# Patient Record
Sex: Male | Born: 1970
Health system: Southern US, Community
[De-identification: ages and names within clinical notes are randomized; demographics above are authoritative.]

## PROBLEM LIST (undated history)

## (undated) HISTORY — PX: KNEE SURGERY: SHX244

## (undated) HISTORY — PX: OTHER SURGICAL HISTORY: SHX169

---
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
## 1998-08-15 ENCOUNTER — Emergency Department (HOSPITAL_COMMUNITY): Admission: EM | Admit: 1998-08-15 | Discharge: 1998-08-15 | Payer: Self-pay | Admitting: Emergency Medicine

## 2003-01-23 ENCOUNTER — Emergency Department (HOSPITAL_COMMUNITY): Admission: AD | Admit: 2003-01-23 | Discharge: 2003-01-24 | Payer: Self-pay | Admitting: Emergency Medicine

## 2003-07-18 ENCOUNTER — Emergency Department (HOSPITAL_COMMUNITY): Admission: EM | Admit: 2003-07-18 | Discharge: 2003-07-18 | Payer: Self-pay | Admitting: Emergency Medicine

## 2004-05-21 ENCOUNTER — Emergency Department (HOSPITAL_COMMUNITY): Admission: EM | Admit: 2004-05-21 | Discharge: 2004-05-21 | Payer: Self-pay | Admitting: Family Medicine

## 2006-05-13 ENCOUNTER — Emergency Department (HOSPITAL_COMMUNITY): Admission: EM | Admit: 2006-05-13 | Discharge: 2006-05-13 | Payer: Self-pay | Admitting: Emergency Medicine

## 2008-11-06 ENCOUNTER — Emergency Department (HOSPITAL_COMMUNITY): Admission: EM | Admit: 2008-11-06 | Discharge: 2008-11-06 | Payer: Self-pay | Admitting: Family Medicine

## 2008-11-29 ENCOUNTER — Emergency Department (HOSPITAL_COMMUNITY): Admission: EM | Admit: 2008-11-29 | Discharge: 2008-11-30 | Payer: Self-pay | Admitting: Emergency Medicine

## 2010-08-08 ENCOUNTER — Ambulatory Visit: Admit: 2010-08-08 | Payer: Self-pay | Admitting: Internal Medicine

## 2010-08-08 ENCOUNTER — Ambulatory Visit (HOSPITAL_COMMUNITY)
Admission: RE | Admit: 2010-08-08 | Discharge: 2010-08-08 | Payer: Self-pay | Source: Home / Self Care | Attending: Internal Medicine | Admitting: Internal Medicine

## 2010-08-23 NOTE — Op Note (Signed)
  NAME:  William Haley, William Haley             ACCOUNT NO.:  [id number]  MEDICAL RECORD NO.:  [id number]          PATIENT TYPE:  AMB  LOCATION:  DAY                           FACILITY:  [facility]  PHYSICIAN:  Lionel December, M.D.    DATE OF BIRTH:  1970-11-24  DATE OF PROCEDURE:  08/08/2010 DATE OF DISCHARGE:                              OPERATIVE REPORT   INDICATION:  Favor is a 40 year old African American male who is undergoing high-risk screening colonoscopy.  His father died from metastatic colon carcinoma at age 50 shortly after diagnosis. Therefore, it is presumed that his colon cancer started younger than 28.  Procedure risks were reviewed with the patient.  Informed consent was obtained.  MEDICATIONS FOR CONSCIOUS SEDATION:  Demerol 50 mg IV, Versed 6 mg IV.  FINDINGS:  Procedure performed in endoscopy suite.  The patient's vital signs and O2 sat were monitored during the procedure and remained stable.  The patient was placed in left lateral recumbent position and rectal examination was performed.  No abnormality noted on external or digital exam.  Pentax videoscope was placed in the rectum and advanced under vision into sigmoid colon and beyond.  Preparation was excellent.  Scope was passed into the cecum which was identified by appendiceal orifice and ileocecal valve.  The right-to-left of appendiceal orifice revealed expanded mucosa with abnormal section.  It was therefore removed via biopsy to make sure this is not a small serrated adenoma.  As the scope was withdrawn, rest of the colonic mucosa was carefully examined.  There were 2 tiny polyps at transverse colon which were ablated via cold biopsy and submitted in 1 container.  There was another 2-3-mm polyp at rectum with clear preformed surface.  This was easily ablated via cold biopsy.  Scope was retroflexed to examine anorectal junction.  He has some focal erythema, felt to be secondary to prep.  Endoscope was then withdrawn.   The patient tolerated the procedure well.  Withdrawal time was 20 minutes.  FINAL DIAGNOSES: 1. Focal area of mucosal thickening at the right lip of appendiceal     orifice.  It was biopsied to make sure it is not a small adenoma. 2. Two small polyps ablated via cold biopsy from transverse colon and     submitted in 1 container. 3. Small polyp ablated via cold biopsy from the rectum.  RECOMMENDATIONS: 1. Standard instructions given. 2. I will be contacting [facility] with biopsy results and further     recommendations.  His next exam possibly would be in 5 years.     Lionel December, M.D.     NR/MEDQ  D:  08/08/2010  T:  08/08/2010  Job:  [id number]  cc:   Thora Lance, M.D. Fax: [phone]  Electronically Signed by Lionel December M.D. on 08/23/2010 01:04:47 PM

## 2010-10-29 LAB — POCT I-STAT, CHEM 8
BUN: 11 mg/dL (ref 6–23)
Chloride: 105 mEq/L (ref 96–112)
Creatinine, Ser: 1.4 mg/dL (ref 0.4–1.5)
Hemoglobin: 17.7 g/dL — ABNORMAL HIGH (ref 13.0–17.0)
Potassium: 3.3 mEq/L — ABNORMAL LOW (ref 3.5–5.1)
Sodium: 139 mEq/L (ref 135–145)

## 2010-10-29 LAB — DIFFERENTIAL
Eosinophils Absolute: 0 10*3/uL (ref 0.0–0.7)
Lymphocytes Relative: 52 % — ABNORMAL HIGH (ref 12–46)
Lymphs Abs: 2.8 10*3/uL (ref 0.7–4.0)
Neutro Abs: 2 10*3/uL (ref 1.7–7.7)
Neutrophils Relative %: 37 % — ABNORMAL LOW (ref 43–77)

## 2010-10-29 LAB — CBC
Platelets: 215 10*3/uL (ref 150–400)
WBC: 5.3 10*3/uL (ref 4.0–10.5)

## 2010-10-29 LAB — URINALYSIS, ROUTINE W REFLEX MICROSCOPIC
Hgb urine dipstick: NEGATIVE
Specific Gravity, Urine: 1.016 (ref 1.005–1.030)
Urobilinogen, UA: 0.2 mg/dL (ref 0.0–1.0)

## 2011-03-19 ENCOUNTER — Encounter: Payer: Self-pay | Admitting: Orthopedic Surgery

## 2011-03-19 ENCOUNTER — Ambulatory Visit (INDEPENDENT_AMBULATORY_CARE_PROVIDER_SITE_OTHER): Payer: 59 | Admitting: Orthopedic Surgery

## 2011-03-19 VITALS — Resp 18 | Ht 70.0 in | Wt 207.0 lb

## 2011-03-19 DIAGNOSIS — M2350 Chronic instability of knee, unspecified knee: Secondary | ICD-10-CM

## 2011-03-19 DIAGNOSIS — M238X9 Other internal derangements of unspecified knee: Secondary | ICD-10-CM

## 2011-03-19 DIAGNOSIS — Z9889 Other specified postprocedural states: Secondary | ICD-10-CM

## 2011-03-19 DIAGNOSIS — M23329 Other meniscus derangements, posterior horn of medial meniscus, unspecified knee: Secondary | ICD-10-CM

## 2011-03-19 NOTE — Patient Instructions (Signed)
Call us to schedule surgery

## 2011-03-19 NOTE — Progress Notes (Signed)
Chief complaint: Discomfort LEFT knee HPI:(4) This is a 40 year old male who had an ACL reconstruction of the LEFT knee with partial medial meniscectomy in 1989 with an iliotibial band graft presents complaining of swelling sharp pain which is intermittent associated with locking primarily after exercise such as golf.  He complains of pain walking up steps.  The pain is diffuse and not localized to a specific area.  ROS:(2) He denies any problems and her review of systems  PFSH: (1) History reviewed. No pertinent past medical history. Past Surgical History  Procedure Date  . Left knee 1989   . Knee surgery left knee 1989   History   Social History  . Marital Status: Married    Spouse Name: N/A    Number of Children: N/A  . Years of Education: post grad.   Occupational History  . healthcare    Social History Main Topics  . Smoking status: Never Smoker   . Smokeless tobacco: Not on file  . Alcohol Use: No  . Drug Use: Not on file  . Sexually Active: Not on file   Other Topics Concern  . Not on file   Social History Narrative  . No narrative on file     Physical Exam(12) GENERAL: normal development   CDV: pulses are normal   Skin: normal  Lymph: nodes were not palpable/normal  Psychiatric: awake, alert and oriented  Neuro: normal sensation  MSK Ambulation is normal  Upper extremity exam  Inspection and palpation revealed no abnormalities in the upper extremities.  Range of motion is full without contracture.  Motor exam is normal with grade 5 strength.  The joints are fully reduced without subluxation.  There is no atrophy or tremor and muscle tone is normal.  All joints are stable.     RIGHT knee inspection no tenderness or swelling, range of motion was normal.  Strength and stability assessment was normal as well. 1 LEFT knee evaluation reveals no tenderness along the joint lines no swelling.  There is crepitance in the patellofemoral joint 2 Range of  motion is 112 3 Strength is normal 4 The ACL feels stable, the PCL feels stable collateral ligaments are stable 5 The medial compartment is mildly tender McMurray sign is equivocal   Imaging: Summary of x-rays there is medial compartment narrowing with spurring at the margins of the femur.  There is a staple in the tibia from previous ACL reconstruction.  In the tunnel sites are more anterior than ideal.  Assessment: Osteoarthritis and possible medial meniscal tear LEFT knee  As discussed with Mr. Silversmith I think he is having arthritis from his previous knee injuries which is starting to get worse he may have a meniscal tear recurrent his ACL graft seems to have stabilized his knee.  He is not having instability.  I recommended a arthroscopic debridement and partial meniscectomy if needed LEFT knee  He will call us when he is ready to schedule surgery.  X-ray report 3 views LEFT knee  Reason for x-ray LEFT knee pain  The overall line of the knee is slight varus.  There is joint space narrowing medially with marginal spurs on the femoral and tibial margins.  There is evidence of previous ACL reconstructive surgery with anterior position of the femoral and tibial tunnel and a staple in the tibial proximal shaft.  Impression osteoarthritis with slight varus and evidence of previous ACL reconstructive surgery.    Plan: As stated above

## 2011-06-17 ENCOUNTER — Ambulatory Visit (INDEPENDENT_AMBULATORY_CARE_PROVIDER_SITE_OTHER): Payer: 59 | Admitting: Orthopedic Surgery

## 2011-06-17 ENCOUNTER — Encounter: Payer: Self-pay | Admitting: Orthopedic Surgery

## 2011-06-17 DIAGNOSIS — Z9889 Other specified postprocedural states: Secondary | ICD-10-CM

## 2011-06-17 DIAGNOSIS — M23329 Other meniscus derangements, posterior horn of medial meniscus, unspecified knee: Secondary | ICD-10-CM

## 2011-06-17 NOTE — Patient Instructions (Signed)
You have been scheduled for surgery.  All surgeries carry some risk.  Remember you always have the option of continued nonsurgical treatment. However in this situation the risks vs. the benefits favor surgery as the best treatment option. The risks of the surgery includes the following but is not limited to bleeding, infection, pulmonary embolus, death from anesthesia, nerve injury vascular injury or need for further surgery, continued pain.  Specific to this procedure the following risks and complications are rare but possible Stiffness, pain, swelling

## 2011-06-17 NOTE — Progress Notes (Signed)
Mr. Mansouri comes in today for preoperative evaluation for arthroscopy of his LEFT knee as previously discussed.  Preop visit to be completed at a later date with a history and physical.

## 2011-06-27 ENCOUNTER — Other Ambulatory Visit: Payer: Self-pay | Admitting: *Deleted

## 2011-06-27 ENCOUNTER — Encounter: Payer: Self-pay | Admitting: Orthopedic Surgery

## 2011-07-11 ENCOUNTER — Encounter (HOSPITAL_COMMUNITY): Payer: Self-pay

## 2011-07-18 ENCOUNTER — Encounter (HOSPITAL_COMMUNITY): Payer: Self-pay

## 2011-07-18 ENCOUNTER — Telehealth: Payer: Self-pay | Admitting: Orthopedic Surgery

## 2011-07-18 ENCOUNTER — Encounter (HOSPITAL_COMMUNITY)
Admission: RE | Admit: 2011-07-18 | Discharge: 2011-07-18 | Disposition: A | Discharge status: Home / Self Care | Payer: 59 | Source: Ambulatory Visit | Attending: Orthopedic Surgery | Admitting: Orthopedic Surgery

## 2011-07-18 LAB — SURGICAL PCR SCREEN
MRSA, PCR: NEGATIVE
Staphylococcus aureus: NEGATIVE

## 2011-07-18 NOTE — Telephone Encounter (Signed)
Contacted insurer, [place], [place] # [phone] re: out-patient surgery scheduled 07/28/11 at [facility], left knee, CPT codes 09811, [phone].  Per Dawn, no pre-authorization required for out-patient procedures.

## 2011-07-18 NOTE — Patient Instructions (Addendum)
A20 JACOB CICERO  07/18/2011   Your procedure is scheduled on:  07/25/3011  Report to [facility] at  615  AM.  Call this number if you have problems the morning of surgery: [phone]   Remember:   Do not eat food:After Midnight.  May have clear liquids:until Midnight .  Clear liquids include soda, tea, black coffee, apple or grape juice, broth.  Take these medicines the morning of surgery with A SIP OF WATER: none   Do not wear jewelry, make-up or nail polish.  Do not wear lotions, powders, or perfumes. You may wear deodorant.  Do not shave 48 hours prior to surgery.  Do not bring valuables to the hospital.  Contacts, dentures or bridgework may not be worn into surgery.  Leave suitcase in the car. After surgery it may be brought to your room.  For patients admitted to the hospital, checkout time is 11:00 AM the day of discharge.   Patients discharged the day of surgery will not be allowed to drive home.  Name and phone number of your driver: family  Special Instructions: CHG Shower Use Special Wash: 1/2 bottle night before surgery and 1/2 bottle morning of surgery.   Please read over the following fact sheets that you were given: Pain Booklet, MRSA Information, Surgical Site Infection Prevention, Anesthesia Post-op Instructions and Care and Recovery After Surgery rthroscopic Procedure, Knee An arthroscopic procedure can find what is wrong with your knee. PROCEDURE Arthroscopy is a surgical technique that allows your orthopedic surgeon to diagnose and treat your knee injury with accuracy. They will look into your knee through a small instrument. This is almost like a small (pencil sized) telescope. Because arthroscopy affects your knee less than open knee surgery, you can anticipate a more rapid recovery. Taking an active role by following your caregiver's instructions will help with rapid and complete recovery. Use crutches, rest, elevation, ice, and knee exercises as instructed. The  length of recovery depends on various factors including type of injury, age, physical condition, medical conditions, and your rehabilitation. Your knee is the joint between the large bones (femur and tibia) in your leg. Cartilage covers these bone ends which are smooth and slippery and allow your knee to bend and move smoothly. Two menisci, thick, semi-lunar shaped pads of cartilage which form a rim inside the joint, help absorb shock and stabilize your knee. Ligaments bind the bones together and support your knee joint. Muscles move the joint, help support your knee, and take stress off the joint itself. Because of this all programs and physical therapy to rehabilitate an injured or repaired knee require rebuilding and strengthening your muscles. AFTER THE PROCEDURE  After the procedure, you will be moved to a recovery area until most of the effects of the medication have worn off. Your caregiver will discuss the test results with you.   Only take over-the-counter or prescription medicines for pain, discomfort, or fever as directed by your caregiver.  SEEK MEDICAL CARE IF:   You have increased bleeding from your wounds.   You see redness, swelling, or have increasing pain in your wounds.   You have pus coming from your wound.   You have an oral temperature above 102 F (38.9 C).   You notice a bad smell coming from the wound or dressing.   You have severe pain with any motion of your knee.  SEEK IMMEDIATE MEDICAL CARE IF:   You develop a rash.   You have difficulty breathing.  You have any allergic problems.  Document Released: 07/04/2000 Document Revised: 03/19/2011 Document Reviewed: 01/26/2008 [facility] Patient Information 2012 [place].PATIENT INSTRUCTIONS POST-ANESTHESIA  IMMEDIATELY FOLLOWING SURGERY:  Do not drive or operate machinery for the first twenty four hours after surgery.  Do not make any important decisions for twenty four hours after surgery or while taking  narcotic pain medications or sedatives.  If you develop intractable nausea and vomiting or a severe headache please notify your doctor immediately.  FOLLOW-UP:  Please make an appointment with your surgeon as instructed. You do not need to follow up with anesthesia unless specifically instructed to do so.  WOUND CARE INSTRUCTIONS (if applicable):  Keep a dry clean dressing on the anesthesia/puncture wound site if there is drainage.  Once the wound has quit draining you may leave it open to air.  Generally you should leave the bandage intact for twenty four hours unless there is drainage.  If the epidural site drains for more than 36-48 hours please call the anesthesia department.  QUESTIONS?:  Please feel free to call your physician or the hospital operator if you have any questions, and they will be happy to assist you.     [place] [phone]

## 2011-07-24 NOTE — H&P (Signed)
Chief complaint: Discomfort LEFT knee  HPI:(4) This is a 41 year old male who had an ACL reconstruction of the LEFT knee with partial medial meniscectomy in 1989 with an iliotibial band graft presents complaining of swelling sharp pain which is intermittent associated with locking primarily after exercise such as golf. He complains of pain walking up steps. The pain is diffuse and not localized to a specific area.  ROS:(2) He denies any problems and her review of systems  PFSH: (1) History reviewed. No pertinent past medical history.  Past Surgical History   Procedure  Date   .  Left knee 1989    .  Knee surgery  left knee 1989    History    Social History   .  Marital Status:  Married     Spouse Name:  N/A     Number of Children:  N/A   .  Years of Education:  post grad.    Occupational History   .  healthcare     Social History Main Topics   .  Smoking status:  Never Smoker   .  Smokeless tobacco:  Not on file   .  Alcohol Use:  No   .  Drug Use:  Not on file   .  Sexually Active:  Not on file    Other Topics  Concern   .  Not on file    Social History Narrative   .  No narrative on file    Physical Exam(12)  GENERAL: normal development  CDV: pulses are normal  Skin: normal  Lymph: nodes were not palpable/normal  Psychiatric: awake, alert and oriented  Neuro: normal sensation  MSK Ambulation is normal  Upper extremity exam  Inspection and palpation revealed no abnormalities in the upper extremities. Range of motion is full without contracture.  Motor exam is normal with grade 5 strength.  The joints are fully reduced without subluxation.  There is no atrophy or tremor and muscle tone is normal. All joints are stable.  RIGHT knee inspection no tenderness or swelling, range of motion was normal. Strength and stability assessment was normal as well.  1 LEFT knee evaluation reveals no tenderness along the joint lines no swelling. There is crepitance in the  patellofemoral joint  2 Range of motion is 112  3 Strength is normal  4 The ACL feels stable, the PCL feels stable collateral ligaments are stable  5 The medial compartment is mildly tender McMurray sign is equivocal  Imaging: Summary of x-rays there is medial compartment narrowing with spurring at the margins of the femur. There is a staple in the tibia from previous ACL reconstruction. In the tunnel sites are more anterior than ideal.  Assessment: Osteoarthritis and possible medial meniscal tear LEFT knee  As discussed with Mr. Carithers I think he is having arthritis from his previous knee injuries which is starting to get worse he may have a meniscal tear recurrent his ACL graft seems to have stabilized his knee. He is not having instability. I recommended a arthroscopic debridement and partial meniscectomy if needed LEFT knee  He will call us when he is ready to schedule surgery.  X-ray report 3 views LEFT knee  Reason for x-ray LEFT knee pain  The overall line of the knee is slight varus. There is joint space narrowing medially with marginal spurs on the femoral and tibial margins. There is evidence of previous ACL reconstructive surgery with anterior position of the femoral and tibial tunnel and  a staple in the tibial proximal shaft.  Impression osteoarthritis with slight varus and evidence of previous ACL reconstructive surgery.  Plan:  I recommended a arthroscopic debridement and partial meniscectomy if needed LEFT knee

## 2011-07-25 ENCOUNTER — Ambulatory Visit (HOSPITAL_COMMUNITY)
Admission: RE | Admit: 2011-07-25 | Discharge: 2011-07-25 | Disposition: A | Discharge status: Home / Self Care | Payer: 59 | Source: Ambulatory Visit | Attending: Orthopedic Surgery | Admitting: Orthopedic Surgery

## 2011-07-25 ENCOUNTER — Encounter (HOSPITAL_COMMUNITY): Payer: Self-pay | Admitting: Anesthesiology

## 2011-07-25 ENCOUNTER — Encounter (HOSPITAL_COMMUNITY): Payer: Self-pay | Admitting: *Deleted

## 2011-07-25 ENCOUNTER — Encounter (HOSPITAL_COMMUNITY)
Admission: RE | Disposition: A | Discharge status: Home / Self Care | Payer: Self-pay | Source: Ambulatory Visit | Attending: Orthopedic Surgery

## 2011-07-25 DIAGNOSIS — IMO0002 Reserved for concepts with insufficient information to code with codable children: Secondary | ICD-10-CM | POA: Insufficient documentation

## 2011-07-25 DIAGNOSIS — Z01812 Encounter for preprocedural laboratory examination: Secondary | ICD-10-CM | POA: Insufficient documentation

## 2011-07-25 DIAGNOSIS — M23329 Other meniscus derangements, posterior horn of medial meniscus, unspecified knee: Secondary | ICD-10-CM

## 2011-07-25 DIAGNOSIS — M23305 Other meniscus derangements, unspecified medial meniscus, unspecified knee: Secondary | ICD-10-CM | POA: Insufficient documentation

## 2011-07-25 DIAGNOSIS — M1710 Unilateral primary osteoarthritis, unspecified knee: Secondary | ICD-10-CM | POA: Insufficient documentation

## 2011-07-25 SURGERY — ARTHROSCOPY, KNEE, WITH MEDIAL MENISCECTOMY
Anesthesia: General | Site: Knee | Laterality: Left | Wound class: Clean

## 2011-07-25 MED ORDER — ACETAMINOPHEN 325 MG PO TABS: 325.0000 mg | ORAL_TABLET | ORAL | Status: DC | PRN

## 2011-07-25 MED ORDER — PROPOFOL 10 MG/ML IV BOLUS
INTRAVENOUS | Status: DC | PRN
  Administered 2011-07-25: 170 mg via INTRAVENOUS

## 2011-07-25 MED ORDER — ACETAMINOPHEN 500 MG PO TABS
500.0000 mg | ORAL_TABLET | Freq: Once | ORAL | Status: AC
  Administered 2011-07-25: 500 mg via ORAL

## 2011-07-25 MED ORDER — ONDANSETRON HCL 4 MG/2ML IJ SOLN
4.0000 mg | Freq: Once | INTRAMUSCULAR | Status: AC
  Administered 2011-07-25: 4 mg via INTRAVENOUS

## 2011-07-25 MED ORDER — CELECOXIB 100 MG PO CAPS
400.0000 mg | ORAL_CAPSULE | Freq: Once | ORAL | Status: AC
  Administered 2011-07-25: 400 mg via ORAL

## 2011-07-25 MED ORDER — SODIUM CHLORIDE 0.9 % IR SOLN
Status: DC | PRN
  Administered 2011-07-25: 1000 mL

## 2011-07-25 MED ORDER — ONDANSETRON HCL 4 MG/2ML IJ SOLN: 4.0000 mg | Freq: Once | INTRAMUSCULAR | Status: DC

## 2011-07-25 MED ORDER — CEFAZOLIN SODIUM 1-5 GM-% IV SOLN
INTRAVENOUS | Status: AC
  Filled 2011-07-25: qty 50

## 2011-07-25 MED ORDER — EPHEDRINE SULFATE 50 MG/ML IJ SOLN
INTRAMUSCULAR | Status: AC
  Filled 2011-07-25: qty 1

## 2011-07-25 MED ORDER — ONDANSETRON HCL 4 MG/2ML IJ SOLN
INTRAMUSCULAR | Status: AC
  Administered 2011-07-25: 4 mg via INTRAVENOUS
  Filled 2011-07-25: qty 2

## 2011-07-25 MED ORDER — CEFAZOLIN SODIUM 1-5 GM-% IV SOLN
INTRAVENOUS | Status: DC | PRN
  Administered 2011-07-25: 1 g via INTRAVENOUS

## 2011-07-25 MED ORDER — ACETAMINOPHEN 500 MG PO TABS
ORAL_TABLET | ORAL | Status: AC
  Administered 2011-07-25: 500 mg via ORAL
  Filled 2011-07-25: qty 1

## 2011-07-25 MED ORDER — BUPIVACAINE-EPINEPHRINE PF 0.5-1:200000 % IJ SOLN
INTRAMUSCULAR | Status: AC
  Filled 2011-07-25: qty 20

## 2011-07-25 MED ORDER — MIDAZOLAM HCL 2 MG/2ML IJ SOLN
INTRAMUSCULAR | Status: AC
  Administered 2011-07-25: 2 mg via INTRAVENOUS
  Filled 2011-07-25: qty 2

## 2011-07-25 MED ORDER — ONDANSETRON HCL 4 MG/2ML IJ SOLN: 4.0000 mg | Freq: Once | INTRAMUSCULAR | Status: DC | PRN

## 2011-07-25 MED ORDER — ONDANSETRON HCL 4 MG/2ML IJ SOLN
INTRAMUSCULAR | Status: AC
  Filled 2011-07-25: qty 2

## 2011-07-25 MED ORDER — LIDOCAINE HCL (PF) 1 % IJ SOLN
INTRAMUSCULAR | Status: AC
  Filled 2011-07-25: qty 5

## 2011-07-25 MED ORDER — LIDOCAINE HCL 1 % IJ SOLN
INTRAMUSCULAR | Status: DC | PRN
  Administered 2011-07-25: 30 mg via INTRADERMAL

## 2011-07-25 MED ORDER — FENTANYL CITRATE 0.05 MG/ML IJ SOLN
INTRAMUSCULAR | Status: AC
  Filled 2011-07-25: qty 2

## 2011-07-25 MED ORDER — KETOROLAC TROMETHAMINE 30 MG/ML IJ SOLN
30.0000 mg | Freq: Once | INTRAMUSCULAR | Status: AC
  Administered 2011-07-25: 30 mg via INTRAVENOUS

## 2011-07-25 MED ORDER — LACTATED RINGERS IV SOLN
INTRAVENOUS | Status: DC
  Administered 2011-07-25: 08:00:00 via INTRAVENOUS

## 2011-07-25 MED ORDER — PROPOFOL 10 MG/ML IV EMUL
INTRAVENOUS | Status: AC
  Filled 2011-07-25: qty 20

## 2011-07-25 MED ORDER — EPINEPHRINE HCL 1 MG/ML IJ SOLN
INTRAMUSCULAR | Status: AC
  Filled 2011-07-25: qty 6

## 2011-07-25 MED ORDER — HYDROCODONE-ACETAMINOPHEN 5-325 MG PO TABS
1.0000 | ORAL_TABLET | Freq: Once | ORAL | Status: AC
  Administered 2011-07-25: 1 via ORAL

## 2011-07-25 MED ORDER — IBUPROFEN 800 MG PO TABS: 800.0000 mg | ORAL_TABLET | Freq: Three times a day (TID) | ORAL | Status: AC | PRN

## 2011-07-25 MED ORDER — HYDROCODONE-ACETAMINOPHEN 7.5-325 MG PO TABS: 1.0000 | ORAL_TABLET | ORAL | Status: AC | PRN

## 2011-07-25 MED ORDER — GLYCOPYRROLATE 0.2 MG/ML IJ SOLN
0.2000 mg | Freq: Once | INTRAMUSCULAR | Status: AC | PRN
  Administered 2011-07-25: 0.2 mg via INTRAVENOUS

## 2011-07-25 MED ORDER — BUPIVACAINE-EPINEPHRINE PF 0.5-1:200000 % IJ SOLN
INTRAMUSCULAR | Status: DC | PRN
  Administered 2011-07-25: 60 mL

## 2011-07-25 MED ORDER — HYDROCODONE-ACETAMINOPHEN 5-325 MG PO TABS
ORAL_TABLET | ORAL | Status: AC
  Administered 2011-07-25: 1 via ORAL
  Filled 2011-07-25: qty 1

## 2011-07-25 MED ORDER — CELECOXIB 100 MG PO CAPS
ORAL_CAPSULE | ORAL | Status: AC
  Administered 2011-07-25: 400 mg via ORAL
  Filled 2011-07-25: qty 4

## 2011-07-25 MED ORDER — MIDAZOLAM HCL 2 MG/2ML IJ SOLN
1.0000 mg | INTRAMUSCULAR | Status: DC | PRN
  Administered 2011-07-25: 2 mg via INTRAVENOUS

## 2011-07-25 MED ORDER — FENTANYL CITRATE 0.05 MG/ML IJ SOLN: 25.0000 ug | INTRAMUSCULAR | Status: DC | PRN

## 2011-07-25 MED ORDER — SODIUM CHLORIDE 0.9 % IR SOLN
Status: DC | PRN
  Administered 2011-07-25: 09:00:00

## 2011-07-25 MED ORDER — FENTANYL CITRATE 0.05 MG/ML IJ SOLN
INTRAMUSCULAR | Status: DC | PRN
  Administered 2011-07-25: 25 ug via INTRAVENOUS
  Administered 2011-07-25: 50 ug via INTRAVENOUS
  Administered 2011-07-25: 25 ug via INTRAVENOUS

## 2011-07-25 MED ORDER — GLYCOPYRROLATE 0.2 MG/ML IJ SOLN
INTRAMUSCULAR | Status: AC
  Administered 2011-07-25: 0.2 mg via INTRAVENOUS
  Filled 2011-07-25: qty 1

## 2011-07-25 MED ORDER — KETOROLAC TROMETHAMINE 30 MG/ML IJ SOLN
INTRAMUSCULAR | Status: AC
  Administered 2011-07-25: 30 mg via INTRAVENOUS
  Filled 2011-07-25: qty 1

## 2011-07-25 MED ORDER — CEFAZOLIN SODIUM 1-5 GM-% IV SOLN: 1.0000 g | INTRAVENOUS | Status: DC

## 2011-07-25 SURGICAL SUPPLY — 56 items
ARTHROWAND PARAGON T2 (SURGICAL WAND)
BAG HAMPER (MISCELLANEOUS) ×2 IMPLANT
BANDAGE ELASTIC 6 VELCRO NS (GAUZE/BANDAGES/DRESSINGS) ×2 IMPLANT
BLADE AGGRESSIVE PLUS 4.0 (BLADE) ×2 IMPLANT
BLADE SURG SZ11 CARB STEEL (BLADE) ×2 IMPLANT
CHLORAPREP W/TINT 26ML (MISCELLANEOUS) ×2 IMPLANT
CLOTH BEACON ORANGE TIMEOUT ST (SAFETY) ×2 IMPLANT
COOLER CRYO IC GRAV AND TUBE (ORTHOPEDIC SUPPLIES) ×2 IMPLANT
CUFF CRYO KNEE LG 20X31 COOLER (ORTHOPEDIC SUPPLIES) ×1 IMPLANT
CUFF CRYO KNEE18X23 MED (MISCELLANEOUS) IMPLANT
CUFF TOURNIQUET SINGLE 34IN LL (TOURNIQUET CUFF) ×1 IMPLANT
CUFF TOURNIQUET SINGLE 44IN (TOURNIQUET CUFF) IMPLANT
CUTTER ANGLED DBL BITE 4.5 (BURR) IMPLANT
DECANTER SPIKE VIAL GLASS SM (MISCELLANEOUS) ×4 IMPLANT
FLOOR PAD 36X40 (MISCELLANEOUS) ×2
GAUZE SPONGE 4X4 16PLY XRAY LF (GAUZE/BANDAGES/DRESSINGS) ×2 IMPLANT
GAUZE XEROFORM 5X9 LF (GAUZE/BANDAGES/DRESSINGS) ×2 IMPLANT
GLOVE BIOGEL PI IND STRL 7.0 (GLOVE) IMPLANT
GLOVE BIOGEL PI INDICATOR 7.0 (GLOVE) ×1
GLOVE SKINSENSE NS SZ8.0 LF (GLOVE) ×1
GLOVE SKINSENSE STRL SZ8.0 LF (GLOVE) ×1 IMPLANT
GLOVE SS BIOGEL STRL SZ 6.5 (GLOVE) IMPLANT
GLOVE SS N UNI LF 8.5 STRL (GLOVE) ×2 IMPLANT
GLOVE SUPERSENSE BIOGEL SZ 6.5 (GLOVE) ×1
GOWN STRL REIN XL XLG (GOWN DISPOSABLE) ×6 IMPLANT
HLDR LEG FOAM (MISCELLANEOUS) ×1 IMPLANT
IV NS IRRIG 3000ML ARTHROMATIC (IV SOLUTION) ×4 IMPLANT
KIT BLADEGUARD II DBL (SET/KITS/TRAYS/PACK) ×2 IMPLANT
KIT ROOM TURNOVER AP CYSTO (KITS) ×2 IMPLANT
LEG HOLDER FOAM (MISCELLANEOUS) ×1
MANIFOLD NEPTUNE II (INSTRUMENTS) ×2 IMPLANT
MARKER SKIN DUAL TIP RULER LAB (MISCELLANEOUS) ×2 IMPLANT
NDL HYPO 18GX1.5 BLUNT FILL (NEEDLE) ×1 IMPLANT
NDL HYPO 21X1.5 SAFETY (NEEDLE) ×1 IMPLANT
NDL SPNL 18GX3.5 QUINCKE PK (NEEDLE) ×1 IMPLANT
NEEDLE HYPO 18GX1.5 BLUNT FILL (NEEDLE) ×2 IMPLANT
NEEDLE HYPO 21X1.5 SAFETY (NEEDLE) ×2 IMPLANT
NEEDLE SPNL 18GX3.5 QUINCKE PK (NEEDLE) ×2 IMPLANT
NS IRRIG 1000ML POUR BTL (IV SOLUTION) ×2 IMPLANT
PACK ARTHRO LIMB DRAPE STRL (MISCELLANEOUS) ×2 IMPLANT
PAD ABD 5X9 TENDERSORB (GAUZE/BANDAGES/DRESSINGS) ×2 IMPLANT
PAD ARMBOARD 7.5X6 YLW CONV (MISCELLANEOUS) ×2 IMPLANT
PAD FLOOR 36X40 (MISCELLANEOUS) ×1 IMPLANT
PADDING CAST COTTON 6X4 STRL (CAST SUPPLIES) ×2 IMPLANT
SET ARTHROSCOPY INST (INSTRUMENTS) ×2 IMPLANT
SET ARTHROSCOPY PUMP TUBE (IRRIGATION / IRRIGATOR) ×2 IMPLANT
SET BASIN LINEN APH (SET/KITS/TRAYS/PACK) ×2 IMPLANT
SPONGE GAUZE 4X4 12PLY (GAUZE/BANDAGES/DRESSINGS) ×2 IMPLANT
STRIP CLOSURE SKIN 1/2X4 (GAUZE/BANDAGES/DRESSINGS) ×1 IMPLANT
SUT ETHILON 3 0 FSL (SUTURE) IMPLANT
SYR 30ML LL (SYRINGE) ×2 IMPLANT
SYRINGE 10CC LL (SYRINGE) ×2 IMPLANT
WAND 50 DEG COVAC W/CORD (SURGICAL WAND) ×1 IMPLANT
WAND 90 DEG TURBOVAC W/CORD (SURGICAL WAND) IMPLANT
WAND ARTHRO PARAGON T2 (SURGICAL WAND) IMPLANT
YANKAUER SUCT BULB TIP 10FT TU (MISCELLANEOUS) ×6 IMPLANT

## 2011-07-25 NOTE — Interval H&P Note (Signed)
History and Physical Interval Note:  07/25/2011 7:24 AM  William Haley  has presented today for surgery, with the diagnosis of medial meniscal tear  The various methods of treatment have been discussed with the patient and family. After consideration of risks, benefits and other options for treatment, the patient has consented to  Procedure(s):LEFT KNEE ARTHROSCOPY WITH MEDIAL MENISECTOMY as a surgical intervention .  The patients' history has been reviewed, patient examined, no change in status, stable for surgery.  I have reviewed the patients' chart and labs.  Questions were answered to the patient's satisfaction.     Fuller Canada

## 2011-07-25 NOTE — Progress Notes (Signed)
Crutches obtained for pt. States he knows how to crutch-walk.

## 2011-07-25 NOTE — Op Note (Signed)
07/25/2011  8:43 AM  PATIENT:  William Haley  41 y.o. male  PRE-OPERATIVE DIAGNOSIS:  Medial Meniscal Tear Left Knee  POST-OPERATIVE DIAGNOSIS:  Medial Meniscal Tear Left Knee, Osteoarthritis  PROCEDURE:  Procedure(s): KNEE ARTHROSCOPY WITH MEDIAL MENISECTOMY  SURGEON:  Surgeon(s): Fuller Canada, MD  PHYSICIAN ASSISTANT:   ASSISTANTS: none   ANESTHESIA:   general  EBL:  Total I/O In: 100 [I.V.:100] Out: -   BLOOD ADMINISTERED:none  DRAINS: none   LOCAL MEDICATIONS USED:  MARCAINE with epi 60CC  SPECIMEN:  No Specimen  DISPOSITION OF SPECIMEN:  N/A  COUNTS:  YES  TOURNIQUET:  * Missing tourniquet times found for documented tourniquets in log:  11314 *  DICTATION: .Dragon Dictation  PLAN OF CARE: Discharge to home after PACU  PATIENT DISPOSITION:  PACU - hemodynamically stable.   Delay start of Pharmacological VTE agent (>24hrs) due to surgical blood loss or risk of bleeding:  No   Procedure  Singh to the operating room after site was marked and he was given general anesthesia. After successful general anesthesia the left leg was prepped and draped with sterile technique  After timeout was completed a lateral portal was established and the scope was introduced into the joint. A diagnostic arthroscopy was performed with the following findings. The medial compartment was noted to have a significant amount of chondral arthritis on the medial femoral condyle there was a degenerative complex tear of the posterior horn of the medial meniscus and a large medial plica. The notch was noted to have significant scarring but the anterior cruciate ligament was intact. The lateral compartment was essentially normal. Patellofemoral joint was noted to have grade 3 changes of the lateral facet chondral lesion of the lateral trochlea and a large chondral lesion grade 2 if not 3 of the trochlea.  Under anesthesia prior to introduction of the scope the anterior cruciate ligament  was intact by Lachman test and pivot shift test.  A medial portal was established in the medial meniscectomy was performed using a 50 ArthroCare wand. A probe was placed to confirm stabilization of care.  Further debridement was performed into the medial plica was resected the notch was debrided and the lateral trochlea was abraded.  The knee was irrigated and then the joint was injected with Marcaine with epinephrine solution. The portal was closed with 3-0 nylon on each side. A sterile dressing and Cryo/Cuff were placed a Cryo/Cuff was activated  The patient will be weightbearing as tolerated. Therapy can begin on Tuesday all seem on Monday.

## 2011-07-25 NOTE — Anesthesia Preprocedure Evaluation (Signed)
Anesthesia Evaluation  Patient identified by MRN, date of birth, ID band Patient awake    Reviewed: Allergy & Precautions, H&P , NPO status , Patient's Chart, lab work & pertinent test results  Airway Mallampati: I TM Distance: >3 FB Neck ROM: Full    Dental No notable dental hx.    Pulmonary neg pulmonary ROS,    Pulmonary exam normal       Cardiovascular neg cardio ROS Regular Normal    Neuro/Psych Negative Neurological ROS  Negative Psych ROS   GI/Hepatic negative GI ROS, Neg liver ROS,   Endo/Other  Negative Endocrine ROS  Renal/GU negative Renal ROS  Genitourinary negative   Musculoskeletal negative musculoskeletal ROS (+)   Abdominal Normal abdominal exam  (+)   Peds  Hematology negative hematology ROS (+)   Anesthesia Other Findings   Reproductive/Obstetrics                           Anesthesia Physical Anesthesia Plan  ASA: I  Anesthesia Plan: General   Post-op Pain Management:    Induction: Intravenous  Airway Management Planned: LMA  Additional Equipment:   Intra-op Plan:   Post-operative Plan: Extubation in OR  Informed Consent: I have reviewed the patients History and Physical, chart, labs and discussed the procedure including the risks, benefits and alternatives for the proposed anesthesia with the patient or authorized representative who has indicated his/her understanding and acceptance.     Plan Discussed with: CRNA  Anesthesia Plan Comments:         Anesthesia Quick Evaluation

## 2011-07-25 NOTE — Brief Op Note (Signed)
07/25/2011  8:43 AM  PATIENT:  Hoover Browns  41 y.o. male  PRE-OPERATIVE DIAGNOSIS:  Medial Meniscal Tear Left Knee  POST-OPERATIVE DIAGNOSIS:  Medial Meniscal Tear Left Knee, Osteoarthritis  PROCEDURE:  Procedure(s): KNEE ARTHROSCOPY WITH MEDIAL MENISECTOMY  SURGEON:  Surgeon(s): Fuller Canada, MD  PHYSICIAN ASSISTANT:   ASSISTANTS: none   ANESTHESIA:   general  EBL:  Total I/O In: 100 [I.V.:100] Out: -   BLOOD ADMINISTERED:none  DRAINS: none   LOCAL MEDICATIONS USED:  MARCAINE with epi 60CC  SPECIMEN:  No Specimen  DISPOSITION OF SPECIMEN:  N/A  COUNTS:  YES  TOURNIQUET:  * Missing tourniquet times found for documented tourniquets in log:  11314 *  DICTATION: .Dragon Dictation  PLAN OF CARE: Discharge to home after PACU  PATIENT DISPOSITION:  PACU - hemodynamically stable.   Delay start of Pharmacological VTE agent (>24hrs) due to surgical blood loss or risk of bleeding:  No

## 2011-07-25 NOTE — Anesthesia Postprocedure Evaluation (Signed)
  Anesthesia Post-op Note  Patient: William Haley  Procedure(s) Performed:  KNEE ARTHROSCOPY WITH MEDIAL MENISECTOMY - Partial Medial Menisectomy and Extensive Debridement  Patient Location: PACU  Anesthesia Type: General  Level of Consciousness: awake and oriented  Airway and Oxygen Therapy: Patient Spontanous Breathing  Post-op Pain: none  Post-op Assessment: Post-op Vital signs reviewed, Patient's Cardiovascular Status Stable, Respiratory Function Stable and No signs of Nausea or vomiting  Post-op Vital Signs: Reviewed and stable  Complications: No apparent anesthesia complications

## 2011-07-25 NOTE — Anesthesia Procedure Notes (Signed)
Procedure Name: LMA Insertion Date/Time: 07/25/2011 7:46 AM Performed by: Glynn Octave Pre-anesthesia Checklist: Patient identified, Patient being monitored, Emergency Drugs available, Timeout performed and Suction available Patient Re-evaluated:Patient Re-evaluated prior to inductionOxygen Delivery Method: Circle System Utilized Preoxygenation: Pre-oxygenation with 100% oxygen Intubation Type: IV induction Ventilation: Mask ventilation without difficulty LMA: LMA inserted LMA Size: 4.0 Number of attempts: 1 Placement Confirmation: positive ETCO2 and breath sounds checked- equal and bilateral

## 2011-07-25 NOTE — Transfer of Care (Signed)
Immediate Anesthesia Transfer of Care Note  Patient: William Haley  Procedure(s) Performed:  KNEE ARTHROSCOPY WITH MEDIAL MENISECTOMY - Partial Medial Menisectomy and Extensive Debridement  Patient Location: PACU  Anesthesia Type: General  Level of Consciousness: sedated  Airway & Oxygen Therapy: Patient Spontanous Breathing and Patient connected to face mask oxygen  Post-op Assessment: Report given to PACU RN  Post vital signs: Reviewed and stable  Complications: No apparent anesthesia complications

## 2011-07-28 ENCOUNTER — Ambulatory Visit (INDEPENDENT_AMBULATORY_CARE_PROVIDER_SITE_OTHER): Payer: 59 | Admitting: Orthopedic Surgery

## 2011-07-28 ENCOUNTER — Encounter: Payer: Self-pay | Admitting: Orthopedic Surgery

## 2011-07-28 DIAGNOSIS — M1710 Unilateral primary osteoarthritis, unspecified knee: Secondary | ICD-10-CM

## 2011-07-28 DIAGNOSIS — S83249A Other tear of medial meniscus, current injury, unspecified knee, initial encounter: Secondary | ICD-10-CM

## 2011-07-28 DIAGNOSIS — Z9889 Other specified postprocedural states: Secondary | ICD-10-CM

## 2011-07-28 DIAGNOSIS — IMO0002 Reserved for concepts with insufficient information to code with codable children: Secondary | ICD-10-CM

## 2011-07-28 DIAGNOSIS — M179 Osteoarthritis of knee, unspecified: Secondary | ICD-10-CM

## 2011-07-28 NOTE — Patient Instructions (Signed)
Go to PT   Crutches as needed   Continue cryocuff

## 2011-07-28 NOTE — Progress Notes (Signed)
Patient ID: William Haley, male   DOB: 11/15/1970, 41 y.o.   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 The notch was noted to have significant scarring but the anterior cruciate ligament was intact. The lateral compartment was essentially normal. Patellofemoral joint was noted to have grade 3 changes of the lateral facet chondral lesion of the lateral trochlea and a large chondral lesion grade 2 if not 3 of the trochlea.

## 2011-07-30 ENCOUNTER — Ambulatory Visit (HOSPITAL_COMMUNITY)
Admission: RE | Admit: 2011-07-30 | Discharge: 2011-07-30 | Disposition: A | Discharge status: Home / Self Care | Payer: 59 | Source: Ambulatory Visit | Attending: Orthopedic Surgery | Admitting: Orthopedic Surgery

## 2011-07-30 DIAGNOSIS — M25669 Stiffness of unspecified knee, not elsewhere classified: Secondary | ICD-10-CM | POA: Insufficient documentation

## 2011-07-30 DIAGNOSIS — IMO0001 Reserved for inherently not codable concepts without codable children: Secondary | ICD-10-CM | POA: Insufficient documentation

## 2011-07-30 DIAGNOSIS — R269 Unspecified abnormalities of gait and mobility: Secondary | ICD-10-CM | POA: Insufficient documentation

## 2011-07-30 DIAGNOSIS — M2560 Stiffness of unspecified joint, not elsewhere classified: Secondary | ICD-10-CM | POA: Insufficient documentation

## 2011-07-30 DIAGNOSIS — M6281 Muscle weakness (generalized): Secondary | ICD-10-CM | POA: Insufficient documentation

## 2011-07-30 DIAGNOSIS — R262 Difficulty in walking, not elsewhere classified: Secondary | ICD-10-CM | POA: Insufficient documentation

## 2011-07-30 DIAGNOSIS — M25569 Pain in unspecified knee: Secondary | ICD-10-CM | POA: Insufficient documentation

## 2011-07-30 NOTE — Progress Notes (Signed)
Physical Therapy Evaluation  Patient Details  Name: William Haley MRN: [id number] Date of Birth: 10-15-1970  Today's Date: 07/30/2011 Time: 4098-1191 Charges: I EV, 10 ice, 10 TE Time Calculation (min): 43 min  Visit#: 1  of 24   Re-eval: 08/29/11 Assessment Diagnosis: L knee scope Surgical Date: 07/25/11 Next MD Visit: 07-31-11 for stitches out Prior Therapy: none for this issue  Past Medical History: No past medical history on file. Past Surgical History:  Past Surgical History  Procedure Date  . Left knee 1989   . Knee surgery left knee 1989  . Knee arthroscopy 1989    left     Subjective Symptoms/Limitations Symptoms: knee scope 07/25/11 Limitations: Sitting How long can you sit comfortably?: 10-15 mins How long can you stand comfortably?: 20 mins How long can you walk comfortably?: with a limp 10 Repetition: Decreases Symptoms (decreases stiffness) Special Tests: girth testing: R leg: 4" above: 19.4", 2" above  17.5", supra patella: 16.25", 2" below: 15.2, 4" below 13.5", L leg: 4" above: 19.7", 2" above: 18.6", supra patella: 18.1", 2" below: 17.0, 4" below 15.2" Pain Assessment Currently in Pain?: No/denies Pain Score: 0-No pain Pain Location: Knee Pain Orientation: Left Pain Type: Surgical pain Pain Onset: In the past 7 days Pain Relieving Factors: ice Effect of Pain on Daily Activities: decreased normal giat, decreased ROM  Precautions/Restrictions  Restrictions Other Position/Activity Restrictions: WBAT  Prior Functioning  Prior Function Level of Independence: Requires assistive device for independence (walks with one crutch) Able to Take Stairs?: Yes Vocation: Full time employment (up on his feet.  No lifting required for his job) Comments: golf, shoot basketball, weight training.    Assessment RLE AROM (degrees) Right Knee Extension 0-130: -2  Right Knee Flexion 0-140: 132  RLE Strength Right Hip Flexion: 5/5 Right Hip Extension: 5/5 Right  Hip ABduction: 5/5 Right Knee Flexion: 5/5 Right Knee Extension: 5/5 Right Ankle Dorsiflexion: 5/5 Right Ankle Plantar Flexion: 5/5 LLE AROM (degrees) Left Knee Extension 0-130: 3  Left Knee Flexion 0-140: 90  LLE Strength Left Hip Flexion: 4/5 Left Hip Extension: 5/5 Left Hip ABduction: 5/5 Left Knee Flexion: 4/5 Left Knee Extension: 4/5 Left Ankle Dorsiflexion: 5/5 Left Ankle Plantar Flexion: 5/5  Exercise/Treatments Stretches Passive Hamstring Stretch: 3 reps;30 seconds ASupine Quad Sets: 15 reps;Limitations Quad Sets Limitations: 5 " Heel Slides: 15 reps;Limitations Heel Slides Limitations: 5" Straight Leg Raises: 15 reps  Modalities Modalities: Cryotherapy Cryotherapy Number Minutes Cryotherapy: 10 Minutes Cryotherapy Location: Knee;Other (comment) (with heel proped on towel roll) Type of Cryotherapy: Ice pack  Physical Therapy Assessment and Plan  Clinical Impression Statement: 41 y.o. male presents s/p L knee scope on 07/25/11.  He has decreased L knee ROM, decreased L knee strength, increased L leg edema, decreased normal gait pattern.  He would benefit from skilled PT to maximize independence, functional mobility, strength and normalize his gait pattern.   Rehab Potential: Good PT Frequency: Min 3X/week PT Duration: 8 weeks PT Treatment/Interventions: Gait training;Stair training;Functional mobility training;Therapeutic activities;Therapeutic exercise;Balance training;Neuromuscular re-education;Patient/family education;Other (comment) (modalities as needed for pain and swelling, joint mobs ) PT Plan: Continue 3xs/wk x 4 weeks.  Start bike next session, 4" step up, TKE, SLS, balance board, wall slides to 45 degrees,     Goals Home Exercise Program Pt will Perform Home Exercise Program: Independently PT Short Term Goals Time to Complete Short Term Goals: 4 weeks PT Short Term Goal 1: Increase knee ROM to 0-125 to improve symmetry and ability to perform daily  activities.   PT Short Term Goal 2: Patient will demonstrate normal gait pattern on indoor and outdoor surfaces without an assistive device.   PT Short Term Goal 3: The patient will have decreased knee edea within 10% of his other leg for improved edema management and general comfort of left knee.   PT Long Term Goals Time to Complete Long Term Goals: 8 weeks PT Long Term Goal 1: The patient will be able to perform I RM quad/hamstring that will meet the 80% ratio to show readiness to return to sporting activities.   PT Long Term Goal 2: The patient will demonstrate 5/5 knee flex/ext strength to show improved mobility and strength.   Long Term Goal 3: Knee flexion ROM L = R to show improved symmetry and ROM.    Problem List Patient Active Problem List  Diagnoses  . 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 S/P ACL surgery  . S/P left knee arthroscopy  . Abnormality of gait  . Restriction of joint motion  . Muscle weakness (generalized)    PT - End of Session Activity Tolerance: Patient tolerated treatment well General Behavior During Session: [facility] for tasks performed Cognition: [facility] for tasks performed PT Plan of Care PT Home Exercise Plan: see scanned report.   Consulted and Agree with Plan of Care: Patient   Current Outpatient Prescriptions on File Prior to Encounter  Medication Sig Dispense Refill  . HYDROcodone-acetaminophen (NORCO) 7.5-325 MG per tablet Take 1 tablet by mouth every 4 (four) hours as needed for pain.  42 tablet  2  . ibuprofen (ADVIL,MOTRIN) 800 MG tablet Take 1 tablet (800 mg total) by mouth every 8 (eight) hours as needed for pain.  90 tablet  1   Jaeveon Ashland B. Hermila Millis, PT, DPT  07/30/2011, 12:42 PM  Physician Documentation Your signature is required to indicate approval of the treatment plan as stated above.  Please sign and either send electronically or make a copy of this report for your files and return this  physician signed original.   Please mark one 1.__approve of plan  2. ___approve of plan with the following conditions.   ______________________________                                                          _____________________ Physician Signature                                                                                                             Date

## 2011-07-31 ENCOUNTER — Ambulatory Visit (INDEPENDENT_AMBULATORY_CARE_PROVIDER_SITE_OTHER): Payer: 59 | Admitting: Orthopedic Surgery

## 2011-07-31 ENCOUNTER — Encounter: Payer: Self-pay | Admitting: Orthopedic Surgery

## 2011-07-31 VITALS — BP 108/70 | Ht 70.0 in | Wt 205.0 lb

## 2011-07-31 DIAGNOSIS — Z9889 Other specified postprocedural states: Secondary | ICD-10-CM

## 2011-07-31 NOTE — Progress Notes (Signed)
Patient ID: William Haley, male   DOB: 1970/09/01, 41 y.o.   MRN: [id number] Suture LEFT knee.  Sutures are removed.  Wounds are clean.  Patient am pleased with one crutch he does have some swelling which should get better with ice and ambulation.

## 2011-07-31 NOTE — Patient Instructions (Signed)
PT

## 2011-08-01 ENCOUNTER — Ambulatory Visit (HOSPITAL_COMMUNITY)
Admission: RE | Admit: 2011-08-01 | Discharge: 2011-08-01 | Disposition: A | Discharge status: Home / Self Care | Payer: 59 | Source: Ambulatory Visit

## 2011-08-01 DIAGNOSIS — M6281 Muscle weakness (generalized): Secondary | ICD-10-CM

## 2011-08-01 DIAGNOSIS — R269 Unspecified abnormalities of gait and mobility: Secondary | ICD-10-CM

## 2011-08-01 NOTE — Progress Notes (Signed)
Physical Therapy Treatment Patient Details  Name: William Haley MRN: [id number] Date of Birth: [birth year]  Today's Date: 08/01/2011 Time: 4098-1191 Time Calculation (min): 53 min Visit#: 2  of 24   Re-eval: 08/29/11  Charge: therex 43 min Ice 10 min  Subjective: Symptoms/Limitations Symptoms: Pain scale 6/10, pt stated L knee with increased swelling went to MD yesterday to remove swelling. Pain Assessment Currently in Pain?: Yes Pain Score:   6 Pain Location: Knee Pain Orientation: Left  Objective:   Exercise/Treatments Stretches Active Hamstring Stretch: 3 reps;30 seconds;Limitations Active Hamstring Stretch Limitations: with rope Aerobic Stationary Bike: 8' @ 1.0 began with rocking, then backwards then able to make full revolution, seat 13 -->12 Standing Heel Raises: 15 reps;Limitations Heel Raises Limitations: toe raises 15 reps Lateral Step Up: Left;15 reps;Step Height: 4" Forward Step Up: Left;15 reps;Step Height: 4" Wall Squat: 10 reps;10 seconds SLS: L SLS >1' + Other Standing Knee Exercises: balance beam tandem gait 3 RT Supine Quad Sets: 15 reps;Limitations Quad Sets Limitations: 10" holds Heel Slides: 15 reps;Limitations Heel Slides Limitations: 10" Straight Leg Raises: 15 reps;Limitations Straight Leg Raises Limitations: 3#   Modalities Modalities: Cryotherapy Cryotherapy Number Minutes Cryotherapy: 10 Minutes Cryotherapy Location: Knee Type of Cryotherapy: Ice pack  Physical Therapy Assessment and Plan PT Assessment and Plan Clinical Impression Statement: Pt tolerated well toward total treatment.  Pt able to make full revolutions on bike and increased seat from 13 -->12.  Pt able to perform all therex correclty with min cueing for form and technique.  Able to SLS > 1'+ second attempt.  Pt does show increased swelling on L knee which impaired him from proper gait mechanics.   PT Plan: Continue with current POC, progress ROM, strengthening for hip  flexion and knee ext/flex and progress balance activities to SLS on foam next session.  Begin prone quad st and knee flexion.    Goals    Problem List Patient Active Problem List  Diagnoses  . 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 S/P ACL surgery  . S/P left knee arthroscopy  . Abnormality of gait  . Restriction of joint motion  . Muscle weakness (generalized)  . S/P arthroscopy of left knee    PT - End of Session Activity Tolerance: Patient tolerated treatment well General Behavior During Session: [facility] for tasks performed Cognition: [facility] for tasks performed  [name] 08/01/2011, 6:26 PM

## 2011-08-04 ENCOUNTER — Ambulatory Visit (HOSPITAL_COMMUNITY)
Admission: RE | Admit: 2011-08-04 | Discharge: 2011-08-04 | Disposition: A | Discharge status: Home / Self Care | Payer: 59 | Source: Ambulatory Visit | Attending: Orthopedic Surgery | Admitting: Orthopedic Surgery

## 2011-08-04 DIAGNOSIS — R269 Unspecified abnormalities of gait and mobility: Secondary | ICD-10-CM

## 2011-08-04 DIAGNOSIS — M6281 Muscle weakness (generalized): Secondary | ICD-10-CM

## 2011-08-04 NOTE — Progress Notes (Signed)
Physical Therapy Treatment Patient Details  Name: William Haley MRN: [id number] Date of Birth: 06-Dec-1970  Today's Date: 08/04/2011 Time: 4098-1191 Time Calculation (min): 56 min Charges: ice 10 mins, 8 neuro, 31 TE, 8 manual Visit#: 3  of 24   Re-eval: 08/29/11    Subjective: Symptoms/Limitations Symptoms: Patient reports a lot of left calf pain, continued lowe leg edema.   Pain Assessment Currently in Pain?: Yes Pain Score:   2 Pain Location: Knee Pain Orientation: Left Pain Type: Surgical pain Multiple Pain Sites: Yes   Exercise/Treatments Mobility/Balance  Ambulation/Gait Ambulation/Gait: Yes Ambulation/Gait Assistance: 7: Independent Ambulation Distance (Feet): 500 Feet Assistive device: None Gait Pattern: Antalgic     Stretches Gastroc Stretch: 30 seconds;2 reps Soleus Stretch: 2 reps;30 seconds Aerobic Stationary Bike: 8' @ 1.0 began with rocking, then backwards then able to make full revolution, seat 13 -->12 Standing Heel Raises: 15 reps;Limitations Heel Raises Limitations: toe raises 15 reps Lateral Step Up: Left;15 reps;Step Height: 4" Forward Step Up: Left;15 reps;Step Height: 4" Wall Squat: 10 reps;10 seconds;5 seconds Rocker Board: 2 minutes;Limitations Rocker Board Limitations: A/P, light hands.   SLS: 2 x 30" on foam Other Standing Knee Exercises: gait training in hospital, retro gait down long hall in hospital.   Supine Other Supine Knee Exercises: PROM flex/ext 3x 30" Prone  Other Prone Exercises: PROM flex 3x 30"   Modalities Modalities: Cryotherapy Cryotherapy Number Minutes Cryotherapy: 10 Minutes Cryotherapy Location: Knee Type of Cryotherapy: Other (comment) (cryo cuff)  Physical Therapy Assessment and Plan PT Assessment and Plan Clinical Impression Statement: Patient tolerated treatment well.  Added gastroc soleus stretch and PROM this session.  Did well with SLS on foam as well.   PT Plan: Continue gait training, calf  stretch, PROM.  Do exercises missed this session next session     Problem List Patient Active Problem List  Diagnoses  . 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 S/P ACL surgery  . S/P left knee arthroscopy  . Abnormality of gait  . Restriction of joint motion  . Muscle weakness (generalized)  . S/P arthroscopy of left knee    PT Plan of Care PT Home Exercise Plan: standing gastroc/soleus stretch and long sitting massage to calf muscle with baseball.   Consulted and Agree with Plan of Care: Patient  Rollene Rotunda. Tyrisha Benninger, PT, DPT [phone] 08/04/2011, 3:05 PM

## 2011-08-06 ENCOUNTER — Ambulatory Visit (HOSPITAL_COMMUNITY)
Admission: RE | Admit: 2011-08-06 | Discharge: 2011-08-06 | Disposition: A | Discharge status: Home / Self Care | Payer: 59 | Source: Ambulatory Visit | Attending: Internal Medicine | Admitting: Internal Medicine

## 2011-08-06 DIAGNOSIS — M6281 Muscle weakness (generalized): Secondary | ICD-10-CM

## 2011-08-06 DIAGNOSIS — R269 Unspecified abnormalities of gait and mobility: Secondary | ICD-10-CM

## 2011-08-06 NOTE — Progress Notes (Signed)
Physical Therapy Treatment Patient Details  Name: LIONELL MATUSZAK MRN: [id number] Date of Birth: 1970-09-08  Today's Date: 08/06/2011 Time: 1191-4782 Time Calculation (min): 60 min Visit#: 4  of 24   Re-eval: 08/29/11  Charge: therex 50 min Ice 10 min  Subjective: Symptoms/Limitations Symptoms: Pt stated increased stiffness this morning, pain scale 6/10. Pain Assessment Currently in Pain?: Yes Pain Score:   6 Pain Location: Knee Pain Orientation: Left  Objective:   Exercise/Treatments Mobility/Balance  Ambulation/Gait Ambulation/Gait: Yes Ambulation/Gait Assistance: 7: Independent Ambulation Distance (Feet): 800 Feet Assistive device: None Gait Pattern: Antalgic     Stretches Active Hamstring Stretch: 3 reps;30 seconds;Limitations Active Hamstring Stretch Limitations: with rope Quad Stretch: 3 reps;30 seconds Gastroc Stretch: 3 reps;30 seconds Aerobic Stationary Bike: 8' @ 2.0 started at seat 12--> 10 Standing Terminal Knee Extension: 15 reps;Theraband;Limitations Theraband Level (Terminal Knee Extension): Level 4 (Blue) Terminal Knee Extension Limitations: 10" holds Lateral Step Up: Left;15 reps;Step Height: 6";Hand Hold: 1 Forward Step Up: Left;15 reps;Hand Hold: 0;Step Height: 6" Step Down: Left;10 reps;Hand Hold: 1;Step Height: 6" Wall Squat: 15 reps;10 seconds SLS with Vectors: 3x 10" on foam Other Standing Knee Exercises: gait training in hospital for heel --> toe gait and to bend knee when toe off Supine Quad Sets: 15 reps;Limitations Quad Sets Limitations: 10" holds Heel Slides: 10 reps;Limitations Heel Slides Limitations: 10" holds Knee Extension: PROM;3 sets;Limitations Knee Extension Limitations: 3x 30" Prone  Contract/Relax to Increase Flexion: 3 x  Other Prone Exercises: PROM flex 3x 30"      Physical Therapy Assessment and Plan PT Assessment and Plan Clinical Impression Statement: Pt progressing well towards total treatment.  Edema  continues to limit terminal knee extension for appropriate gait.  Strength improving, able to increase step height without difficulty.  Added vector stance for increased knee stability with intermittent HHA for balance to keep proper form. PT Plan: Add prone knee hang next session.    Goals    Problem List Patient Active Problem List  Diagnoses  . 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 S/P ACL surgery  . S/P left knee arthroscopy  . Abnormality of gait  . Restriction of joint motion  . Muscle weakness (generalized)  . S/P arthroscopy of left knee    PT - End of Session Activity Tolerance: Patient tolerated treatment well General Behavior During Session: [facility] for tasks performed Cognition: [facility] for tasks performed  [name] 08/06/2011, 12:47 PM

## 2011-08-07 ENCOUNTER — Ambulatory Visit (HOSPITAL_COMMUNITY)
Admission: RE | Admit: 2011-08-07 | Discharge: 2011-08-07 | Disposition: A | Discharge status: Home / Self Care | Payer: 59 | Source: Ambulatory Visit | Attending: Internal Medicine | Admitting: Internal Medicine

## 2011-08-07 DIAGNOSIS — R269 Unspecified abnormalities of gait and mobility: Secondary | ICD-10-CM

## 2011-08-07 DIAGNOSIS — M6281 Muscle weakness (generalized): Secondary | ICD-10-CM

## 2011-08-07 NOTE — Progress Notes (Signed)
Physical Therapy Treatment Patient Details  Name: William Haley MRN: [id number] Date of Birth: [birth year]  Today's Date: 08/07/2011 Time: 4098-1191 Time Calculation (min): 57 min Visit#: 5  of 24   Re-eval: 08/29/11  Charge: therex 39 min Ice 10 min  Subjective: Symptoms/Limitations Symptoms: Pain scale 1-2/10 L knee Pain Assessment Currently in Pain?: Yes Pain Location: Knee Pain Orientation: Left  Objective:   Exercise/Treatments Stretches Active Hamstring Stretch: 3 reps;30 seconds;Limitations Active Hamstring Stretch Limitations: with rope Quad Stretch: 3 reps;30 seconds Gastroc Stretch: 3 reps;30 seconds Aerobic Stationary Bike: 8' @ .0 started at seat 12--> 11 Standing Heel Raises: 20 reps;Limitations Heel Raises Limitations: toe raises 20 reps Terminal Knee Extension: 15 reps;Theraband;Limitations Theraband Level (Terminal Knee Extension): Level 4 (Blue) Terminal Knee Extension Limitations: 10" holds Lateral Step Up: Left;15 reps;Step Height: 6";Hand Hold: 1 Forward Step Up: Left;15 reps;Hand Hold: 0;Step Height: 6" Step Down: Left;10 reps;Hand Hold: 1;Step Height: 6" Wall Squat: 15 reps;10 seconds SLS with Vectors: 3x 10" on foam Supine Quad Sets: 15 reps;Limitations Quad Sets Limitations: 5" holds Heel Slides: 15 reps Heel Slides Limitations: 10" holds prior PROM Knee Extension: PROM;3 sets;Limitations Knee Extension Limitations: 3x 30" Prone  Prone Knee Hang: 2 minutes;Weights Prone Knee Hang Weights (lbs): 5   Modalities Modalities: Cryotherapy Cryotherapy Number Minutes Cryotherapy: 10 Minutes Cryotherapy Location: Knee Type of Cryotherapy: Ice pack (with 5# and ankle on towel for extension)  Physical Therapy Assessment and Plan PT Assessment and Plan Clinical Impression Statement: Added prone knee hang for extension, pt able to tolerate without difficulty.  Edema continues to limit termnial knee extension for appropriate gait.  Pt did  state he is icing knee 4x a day with knee elevated.   PT Plan: Continue with current POC, strengthening hip flexion, knee flex/ext and increase ROM both directions.    Goals    Problem List Patient Active Problem List  Diagnoses  . 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 S/P ACL surgery  . S/P left knee arthroscopy  . Abnormality of gait  . Restriction of joint motion  . Muscle weakness (generalized)  . S/P arthroscopy of left knee    PT - End of Session Activity Tolerance: Patient tolerated treatment well General Behavior During Session: [facility] for tasks performed Cognition: [facility] for tasks performed  [name] 08/07/2011, 7:02 PM

## 2011-08-08 ENCOUNTER — Ambulatory Visit (HOSPITAL_COMMUNITY): Payer: 59

## 2011-08-11 ENCOUNTER — Ambulatory Visit (HOSPITAL_COMMUNITY)
Admission: RE | Admit: 2011-08-11 | Discharge: 2011-08-11 | Disposition: A | Discharge status: Home / Self Care | Payer: 59 | Source: Ambulatory Visit | Attending: Orthopedic Surgery | Admitting: Orthopedic Surgery

## 2011-08-11 DIAGNOSIS — M6281 Muscle weakness (generalized): Secondary | ICD-10-CM

## 2011-08-11 DIAGNOSIS — R269 Unspecified abnormalities of gait and mobility: Secondary | ICD-10-CM

## 2011-08-11 NOTE — Progress Notes (Signed)
Physical Therapy Treatment Patient Details  Name: William Haley MRN: [id number] Date of Birth: 04-21-1971  Today's Date: 08/11/2011 Time: 7846-9629 Time Calculation (min): 46 min Visit#: 7  of 24   Re-eval: 08/29/11 Charges:  therex 34', ice 10'  Subjective: Symptoms/Limitations Symptoms: Started work today.  left calf still bothering him.  No knee pain today.   Pain Assessment Currently in Pain?: Yes Pain Score:   7 Pain Location: Calf Pain Orientation: Left Pain Type: Acute pain Pain Onset: 1 to 4 weeks ago   Exercise/Treatments Aerobic Stationary Bike: 8' @ 2.0 seat 10 Standing Heel Raises: 20 reps;Limitations Heel Raises Limitations: toe raises 20 reps Terminal Knee Extension: 15 reps;Theraband;Limitations Theraband Level (Terminal Knee Extension): Level 4 (Blue) Terminal Knee Extension Limitations: 10" holds Lateral Step Up: Left;15 reps;Step Height: 6";Hand Hold: 1 Forward Step Up: Left;15 reps;Hand Hold: 0;Step Height: 6" Step Down: Left;10 reps;Step Height: 6";Hand Hold: 0 Wall Squat: 15 reps;10 seconds SLS with Vectors: 3x 10" on foam Supine Short Arc Quad Sets: 15 reps Heel Slides: 10 reps Heel Slides Limitations: prior to PROM Straight Leg Raises: 15 reps;Limitations Straight Leg Raises Limitations: 3# Knee Extension: PROM;3 sets;Limitations Knee Extension Limitations: 3x 30" Knee Flexion: PROM;Limitations Knee Flexion Limitations: 3X30" Prone  Prone Knee Hang: 2 minutes;Weights Prone Knee Hang Weights (lbs): 5   Modalities Modalities: Cryotherapy Cryotherapy Number Minutes Cryotherapy: 10 Minutes Cryotherapy Location: Knee (LEFT) Type of Cryotherapy: Ice pack  Physical Therapy Assessment and Plan PT Assessment and Plan Clinical Impression Statement: Pt. able to complete all therex; VC's for posture/stability with vector and step exercises.  Overall improving ROM and strength;  need to continue to focus on ROM and gait without antalgia. PT  Plan: Add weight to SAQ and begin treadmill to decrease antalgia.     Problem List Patient Active Problem List  Diagnoses  . 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 S/P ACL surgery  . S/P left knee arthroscopy  . Abnormality of gait  . Restriction of joint motion  . Muscle weakness (generalized)  . S/P arthroscopy of left knee    PT - End of Session Activity Tolerance: Patient tolerated treatment well General Behavior During Session: [facility] for tasks performed Cognition: [facility] for tasks performed  Janathan Bribiesca B. Bascom Levels, PTA 08/11/2011, 10:59 AM

## 2011-08-13 ENCOUNTER — Ambulatory Visit (HOSPITAL_COMMUNITY)
Admission: RE | Admit: 2011-08-13 | Discharge: 2011-08-13 | Disposition: A | Discharge status: Home / Self Care | Payer: 59 | Source: Ambulatory Visit | Attending: Orthopedic Surgery | Admitting: Orthopedic Surgery

## 2011-08-13 DIAGNOSIS — R269 Unspecified abnormalities of gait and mobility: Secondary | ICD-10-CM

## 2011-08-13 DIAGNOSIS — M6281 Muscle weakness (generalized): Secondary | ICD-10-CM

## 2011-08-13 NOTE — Progress Notes (Signed)
Physical Therapy Treatment Patient Details  Name: William Haley MRN: [id number] Date of Birth: 1970-10-31  Today's Date: 08/13/2011 Time: 4098-1191 Time Calculation (min): 61 min Charges: 10 ice, 10 gait, 41 TE Visit#: 8  of 24   Re-eval: 08/29/11    Subjective: Pain Assessment Pain Score:   5 Pain Location: Calf Pain Orientation: Left  Exercise/Treatments Stretches Gastroc Stretch: 30 seconds;2 reps Soleus Stretch: 2 reps;30 seconds Aerobic Stationary Bike: 10' @ 4.0 seat 8 Tread Mill: 6' for giat training for atalgia and stride length Standing Heel Raises: 10 reps Heel Raises Limitations: on box for added ROM, toe raises x 20 Terminal Knee Extension: 15 reps;Theraband;Limitations Theraband Level (Terminal Knee Extension): Level 4 (Blue) Terminal Knee Extension Limitations: 5" holds Lateral Step Up: Left;15 reps;Step Height: 6";Hand Hold: 0 Forward Step Up: Left;15 reps;Hand Hold: 0;Step Height: 6" Step Down: Left;Step Height: 6";Hand Hold: 0;15 reps Wall Squat: 15 reps;10 seconds Rocker Board: 2 minutes;Limitations Rocker Board Limitations: A/P, light hands.   SLS with Vectors: 3x 10" on foam Other Standing Knee Exercises: 2 RT stairs no rails in hospital Supine Short Arc Quad Sets: 15 reps;Limitations Short Arc Quad Sets Limitations: 5# Heel Slides: 15 reps Heel Slides Limitations: prior to PROM Straight Leg Raises: 20 reps Straight Leg Raises Limitations: 3# Knee Extension: PROM;3 sets;Limitations Knee Extension Limitations: 3x 30" Knee Flexion: PROM;Limitations Knee Flexion Limitations: 3X30" Prone  Prone Knee Hang: 3 minutes Prone Knee Hang Weights (lbs): 7#  Modalities Modalities: Cryotherapy Cryotherapy Number Minutes Cryotherapy: 10 Minutes Cryotherapy Location: Knee Type of Cryotherapy: Ice pack (supine with left heel proped and 5# weight for ext.  )  Physical Therapy Assessment and Plan Clinical Impression Statement: The patient tolerated  increased weight well.  He continues to complain of left calf pain which I encouraged him to mention to the MD in case the MD wants to do a left leg doppler to r/o DVT.  Continued LE edema, but significantly better than eval.   PT Plan: add LAQ 2# 5" holds, continue to progress other ther ex, no need to do SLS (regular) any more, contine SLS on foam with vectors.  Add prone and standing active knee flexion if the exercises when tried are generally pain free.  Continue to push flexion ROM- patient would like to try to get equal ROM if he can even though he did not have full knee flexion ROM prior to his knee scope.  Do flexion ROM prone next visit (we did supine this visit).  MD note next visit.      Problem List Patient Active Problem List  Diagnoses  . 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 S/P ACL surgery  . S/P left knee arthroscopy  . Abnormality of gait  . Restriction of joint motion  . Muscle weakness (generalized)  . S/P arthroscopy of left knee    PT - End of Session Activity Tolerance: Patient tolerated treatment well PT Plan of Care PT Home Exercise Plan: no new  Janalynn Eder B. Tayvian Holycross, PT, DPT  08/13/2011, 11:52 AM

## 2011-08-14 ENCOUNTER — Ambulatory Visit (INDEPENDENT_AMBULATORY_CARE_PROVIDER_SITE_OTHER): Payer: 59 | Admitting: Orthopedic Surgery

## 2011-08-14 ENCOUNTER — Encounter: Payer: Self-pay | Admitting: Orthopedic Surgery

## 2011-08-14 ENCOUNTER — Ambulatory Visit (HOSPITAL_COMMUNITY)
Admission: RE | Admit: 2011-08-14 | Discharge: 2011-08-14 | Disposition: A | Discharge status: Home / Self Care | Payer: 59 | Source: Ambulatory Visit | Attending: Orthopedic Surgery | Admitting: Orthopedic Surgery

## 2011-08-14 VITALS — BP 110/70 | Ht 70.0 in | Wt 205.0 lb

## 2011-08-14 DIAGNOSIS — M6281 Muscle weakness (generalized): Secondary | ICD-10-CM

## 2011-08-14 DIAGNOSIS — R269 Unspecified abnormalities of gait and mobility: Secondary | ICD-10-CM

## 2011-08-14 DIAGNOSIS — I82419 Acute embolism and thrombosis of unspecified femoral vein: Secondary | ICD-10-CM

## 2011-08-14 DIAGNOSIS — I824Y9 Acute embolism and thrombosis of unspecified deep veins of unspecified proximal lower extremity: Secondary | ICD-10-CM

## 2011-08-14 NOTE — Progress Notes (Signed)
Patient ID: William Haley, male   DOB: Nov 20, 1970, 41 y.o.   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 The notch was noted to have significant scarring but the anterior cruciate ligament was intact. The lateral compartment was essentially normal. Patellofemoral joint was noted to have grade 3 changes of the lateral facet chondral lesion of the lateral trochlea and a large chondral lesion grade 2 if not 3 of the trochlea.    DOS 07/25/2011  [name] continues to make continued progress on his knee he does have some calf tenderness negative Homans sign has some calf swelling.  Although most swelling in his leg has gone down.  Recommend ultrasound.  The patient is going to trip and a plane.  Recommend start Ecotrin one twice a day until he comes back.  The ultrasound will be done tomorrow I will call him with the results.

## 2011-08-14 NOTE — Evaluation (Addendum)
Physical Therapy Re-Evaluation  Patient Details  Name: William Haley MRN: [id number] Date of Birth: 10/29/1970  Today's Date: 08/14/2011 Time: 2440-1027 Time Calculation (min): 55 min Charges: 10 ice, 1 re-eval, I MMT, IROM, 15 TE Visit#: 9  of 24   Re-eval: 09/13/11    Past Medical History: No past medical history on file. Past Surgical History:  Past Surgical History  Procedure Date  . Left knee 1989   . Knee surgery left knee 1989  . Knee arthroscopy 1989    left     Subjective previous filed value = ( ) Symptoms/Limitations Symptoms: "a little" sore today at the knee.   How long can you sit comfortably?: not limited How long can you stand comfortably?: 20-30 mins How long can you walk comfortably?: 20 mins Repetition: Decreases Symptoms Special Tests: bike actually makes him feel better.  Girth measurements as follows: L leg 4" above: 19.6", 2" above: 17.5", superior pole of the patella: 17", 2" below: 16.5", 4" below: 14.9" (L leg: 4" above: 19.7", 2" above: 18.6", supra patella: 18.1", 2" below: 17.0, 4" below 15.2") Pain Assessment Currently in Pain?: Yes Pain Score:   4 Pain Location: Knee Pain Orientation: Left Pain Type: Surgical pain Pain Radiating Towards: left calf pain  Pain Onset: 1 to 4 weeks ago Pain Frequency: Intermittent Pain Relieving Factors: cryocuff, movement Effect of Pain on Daily Activities: decreased normal gait decreased ROM.   Multiple Pain Sites: Yes  Assessment LLE AROM (degrees) Left Knee Extension 0-130: 1  Left Knee Flexion 0-140: 109  LLE PROM (degrees) Left Knee Extension 0-130: 0 Left Knee Flexion 0-140: 116 LLE Strength Left Hip Flexion: 5/5 Left Knee Flexion: 5/5  Exercise/Treatments Stretches Gastroc Stretch: 30 seconds;2 reps Soleus Stretch: 2 reps;30 seconds Aerobic Stationary Bike: 6' @ 4.0 Standing Knee Flexion: 15 reps Lateral Step Up: Left;15 reps;Step Height: 6";Hand Hold: 0 Forward Step Up: Left;15  reps;Hand Hold: 0;Step Height: 6" Step Down: Left;Step Height: 6";Hand Hold: 0;15 reps Wall Squat: 15 reps;5 seconds SLS with Vectors: 5x 10" on foam Seated Long Arc Quad: 15 reps;Weights (5" holds) Long [employer] Weight: 5 lbs. Supine Straight Leg Raises: 10 reps Straight Leg Raises Limitations: 5# Prone  Hamstring Curl: 15 reps Prone Knee Hang: 3 minutes Prone Knee Hang Weights (lbs): 7# Other Prone Exercises: PROM flex 3x 30"  Modalities Modalities: Cryotherapy Cryotherapy Number Minutes Cryotherapy: 10 Minutes Cryotherapy Location: Knee (supine with left heel proped. ) Type of Cryotherapy: Ice pack  Physical Therapy Assessment and Plan Clinical Impression Statement: Re-eval for MD performed today.  The patient continues to report left calf pain and soreness.  Girth measurements reveal a 1" decrease at the joint line and below in swelling.  The patient's knee strength is WFL now, but he is wanting to go back to sporting activities, so we will progress him by assessing his 1 RM next session.  ROM is still lacking ~20 degrees of normal.  He has met 1/4 STGs and 1/3 LTGs.   Rehab Potential: Good PT Frequency: Min 2X/week PT Duration: 4 weeks PT Treatment/Interventions: Gait training;Stair training;Functional mobility training;Therapeutic activities;Therapeutic exercise;Balance training;Neuromuscular re-education;Patient/family education;Wheelchair mobility training;Other (comment) (modalities as needed for pain and swelling, joint mobs.  ) PT Plan: Recommend continuing 2xs/wk 4 more weeks to increase quad strength equality, work on end ROM and progress balanc, strength and gentle sports-type activities.  Check I rep max on knee extension R/L leg next session, add 2# to standing and prone knee flexion, increase prone  hangs to 5'.  Continue PROM knee flexion alternating prone and supine (prone done today).  Add standing lunges.       Goals Home Exercise Program Pt will Perform Home  Exercise Program: Independently PT Goal: Perform Home Exercise Program - Progress: Met PT Short Term Goals Time to Complete Short Term Goals: 4 weeks PT Short Term Goal 1: Increase knee ROM to 0-125 to improve symmetry and ability to perform daily activities PT Short Term Goal 1 - Progress: Progressing toward goal PT Short Term Goal 2: Patient will demonstrate normal gait pattern on indoor and outdoor surfaces without an assistive device PT Short Term Goal 2 - Progress: Progressing toward goal PT Short Term Goal 3: The patient will have decreased knee edea within 10% of his other leg for improved edema management and general comfort of left knee.  PT Long Term Goals Time to Complete Long Term Goals: 4 weeks PT Long Term Goal 1: The patient will be able to perform I RM quad/hamstring that will meet the 80% ratio to show readiness to return to sporting activities PT Long Term Goal 1 - Progress: Not met PT Long Term Goal 2: The patient will demonstrate 5/5 knee flex/ext strength to show improved mobility and strength PT Long Term Goal 2 - Progress: Met Long Term Goal 3: Knee flexion ROM L = R to show improved symmetry and ROM Long Term Goal 3 Progress: Progressing toward goal  Problem List Patient Active Problem List  Diagnoses  . 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 S/P ACL surgery  . S/P left knee arthroscopy  . Abnormality of gait  . Restriction of joint motion  . Muscle weakness (generalized)  . S/P arthroscopy of left knee    PT - End of Session Activity Tolerance: Patient tolerated treatment well General Behavior During Session: [facility] for tasks performed Cognition: [facility] for tasks performed PT Plan of Care PT Home Exercise Plan: no new   Clerence Gubser B. Zakariah Dejarnette, PT, DPT  08/14/2011, 12:35 PM  Physician Documentation Your signature is required to indicate approval of the treatment plan as stated above.  Please sign and either send  electronically or make a copy of this report for your files and return this physician signed original.   Please mark one 1.__approve of plan  2. ___approve of plan with the following conditions.   ______________________________                                                          _____________________ Physician Signature                                                                                                             Date

## 2011-08-14 NOTE — Patient Instructions (Addendum)
GO TO HOSPITAL FOR ULTRASOUND IN THE AM   CONTINUE THERAPY   START ECOTRIN 1 TWICE A DAY

## 2011-08-15 ENCOUNTER — Ambulatory Visit (HOSPITAL_COMMUNITY): Payer: 59

## 2011-08-15 ENCOUNTER — Ambulatory Visit (HOSPITAL_COMMUNITY)
Admission: RE | Admit: 2011-08-15 | Discharge: 2011-08-15 | Disposition: A | Discharge status: Home / Self Care | Payer: 59 | Source: Ambulatory Visit | Attending: Orthopedic Surgery | Admitting: Orthopedic Surgery

## 2011-08-15 ENCOUNTER — Telehealth: Payer: Self-pay | Admitting: Orthopedic Surgery

## 2011-08-15 DIAGNOSIS — M79609 Pain in unspecified limb: Secondary | ICD-10-CM | POA: Insufficient documentation

## 2011-08-15 DIAGNOSIS — I82419 Acute embolism and thrombosis of unspecified femoral vein: Secondary | ICD-10-CM

## 2011-08-15 NOTE — Telephone Encounter (Signed)
No note mistake

## 2011-08-18 ENCOUNTER — Ambulatory Visit (HOSPITAL_COMMUNITY)
Admission: RE | Admit: 2011-08-18 | Discharge: 2011-08-18 | Disposition: A | Discharge status: Home / Self Care | Payer: 59 | Source: Ambulatory Visit | Attending: Orthopedic Surgery | Admitting: Orthopedic Surgery

## 2011-08-18 ENCOUNTER — Telehealth: Payer: Self-pay | Admitting: *Deleted

## 2011-08-18 DIAGNOSIS — M6281 Muscle weakness (generalized): Secondary | ICD-10-CM

## 2011-08-18 DIAGNOSIS — R269 Unspecified abnormalities of gait and mobility: Secondary | ICD-10-CM

## 2011-08-18 NOTE — Progress Notes (Signed)
Physical Therapy Treatment Patient Details  Name: William Haley MRN: [id number] Date of Birth: Jun 05, 1971  Today's Date: 08/18/2011 Time: 4098-1191 Time Calculation (min): 62 min Visit#: 10  of 24   Re-eval: 09/12/11 Charges:  Therex 48', ice 10'    Subjective: Symptoms/Limitations Symptoms: Pt. states his MD appt. went good; Reports more stiffness posterior knee than pain. Pain Assessment Currently in Pain?: No/denies  Objective: 1 RM performed for Quadricep:  R:87#, L:57.5#; pt with 66%  Exercise/Treatments Stretches Knee: Self-Stretch to increase Flexion: 2 reps;60 seconds;Limitations Knee: Self-Stretch Limitations: chair stretch Aerobic Stationary Bike: 6' @ 4.0 Machines for Strengthening Cybex Knee Extension: 2pl 2 sets of 10;  1 RM also completed today Standing Knee Flexion: 15 reps;Limitations Knee Flexion Limitations: 5# Lateral Step Up: Left;15 reps;Step Height: 6";Hand Hold: 0 Forward Step Up: Left;15 reps;Hand Hold: 0;Step Height: 6" Step Down: Left;Step Height: 6";Hand Hold: 0;15 reps Wall Squat: 15 reps SLS with Vectors: 5x 10" on foam Supine Short Arc Quad Sets: 15 reps;Limitations Short Arc Quad Sets Limitations: 5# Heel Slides: 15 reps Heel Slides Limitations: prior to PROM Straight Leg Raises: 15 reps Straight Leg Raises Limitations: 5# Knee Extension: PROM;3 sets;Limitations Knee Extension Limitations: 3x 30" Knee Flexion: PROM;Limitations Knee Flexion Limitations: 3X30" Prone  Hamstring Curl: 15 reps;Limitations Hamstring Curl Limitations: 5# Prone Knee Hang: 5 minutes Prone Knee Hang Weights (lbs): 5# with massage     Physical Therapy Assessment and Plan PT Assessment and Plan Clinical Impression Statement: Completed 1 RM with L being 66% of R quad strength.  Added cybex quad for strengthening and massage to posterior knee while in prone hang to decrease stiffness/promote relaxation.  Added chair stretch for flexion. PT Plan: Continue  POC; instruct with hamstring stretch long-sitting.     Problem List Patient Active Problem List  Diagnoses  . 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 S/P ACL surgery  . S/P left knee arthroscopy  . Abnormality of gait  . Restriction of joint motion  . Muscle weakness (generalized)  . S/P arthroscopy of left knee    PT - End of Session Activity Tolerance: Patient tolerated treatment well General Behavior During Session: [facility] for tasks performed Cognition: [facility] for tasks performed  Amy B. Bascom Levels, PTA 08/18/2011, 12:34 PM

## 2011-08-18 NOTE — Telephone Encounter (Signed)
Stop now that the trip is over and the [place] was normal

## 2011-08-19 NOTE — Telephone Encounter (Signed)
Patient aware.

## 2011-08-20 ENCOUNTER — Ambulatory Visit (HOSPITAL_COMMUNITY)
Admission: RE | Admit: 2011-08-20 | Discharge: 2011-08-20 | Disposition: A | Discharge status: Home / Self Care | Payer: 59 | Source: Ambulatory Visit | Attending: Orthopedic Surgery | Admitting: Orthopedic Surgery

## 2011-08-20 NOTE — Progress Notes (Signed)
Physical Therapy Treatment Patient Details  Name: William Haley MRN: [id number] Date of Birth: 26-May-1971  Today's Date: 08/20/2011 Time: 1650-1750 Time Calculation (min): 60 min Visit#: 11  of 24   Re-eval: 09/12/11 Charges:  therex 32', manual 8', ice 10'    Subjective: Symptoms/Limitations Symptoms: Pt. reports he has no pain today; states he had a little soreness after beginning quad machine last visit. Pain Assessment Currently in Pain?: No/denies   Exercise/Treatments Stretches Knee: Self-Stretch to increase Flexion: 2 reps;60 seconds;Limitations Knee: Self-Stretch Limitations: chair stretch Aerobic Stationary Bike: 8' @ 5.0 Machines for Strengthening Cybex Knee Extension: 3pl 1 X10, 2pl 1X10 Standing Heel Raises: 10 reps Heel Raises Limitations: L only 10X, on box B 10X Knee Flexion: 15 reps;Limitations Knee Flexion Limitations: 5# Lateral Step Up: Left;15 reps;Step Height: 6";Hand Hold: 0 Forward Step Up: Left;15 reps;Hand Hold: 0;Step Height: 6" Step Down: Left;Step Height: 6";Hand Hold: 0;15 reps Wall Squat: 15 reps SLS with Vectors: 10X10"on foam Supine Quad Sets: 15 reps;Limitations Quad Sets Limitations: 5" holds Short Arc [employer]: 20 reps Short Arc Quad Sets Limitations: 5# Heel Slides: 15 reps Heel Slides Limitations: prior to PROM Straight Leg Raises: 15 reps Straight Leg Raises Limitations: 5# Knee Extension: PROM;3 sets;Limitations Knee Extension Limitations: 3x 30" Knee Flexion: PROM;Limitations Knee Flexion Limitations: 3X30" Prone  Prone Knee Hang: 5 minutes Prone Knee Hang Weights (lbs): 5# with massage   Modalities Modalities: Cryotherapy Manual Therapy Manual Therapy: Massage Massage: 5' with prone knee hang Cryotherapy Number Minutes Cryotherapy: 10 Minutes Cryotherapy Location: Knee Type of Cryotherapy: Ice pack  Physical Therapy Assessment and Plan PT Assessment and Plan Clinical Impression Statement: able to increase  quad machine to 3 plates for L LE only ( 27 pounds), palpable/visual release of scar tissue medial, posterior knee with STM during knee hang.  Progressed to single leg heelraise/increased reps without difficulty. PT Plan: Continue to progress; instruct with long sitting hamstring stretch     Problem List Patient Active Problem List  Diagnoses  . 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 S/P ACL surgery  . S/P left knee arthroscopy  . Abnormality of gait  . Restriction of joint motion  . Muscle weakness (generalized)  . S/P arthroscopy of left knee    PT - End of Session Activity Tolerance: Patient tolerated treatment well General Behavior During Session: [facility] for tasks performed Cognition: [facility] for tasks performed  William Haley B. Bascom Levels, PTA 08/20/2011, 5:52 PM

## 2011-08-22 ENCOUNTER — Ambulatory Visit (HOSPITAL_COMMUNITY)
Admission: RE | Admit: 2011-08-22 | Discharge: 2011-08-22 | Disposition: A | Discharge status: Home / Self Care | Payer: 59 | Source: Ambulatory Visit | Attending: Orthopedic Surgery | Admitting: Orthopedic Surgery

## 2011-08-22 DIAGNOSIS — R269 Unspecified abnormalities of gait and mobility: Secondary | ICD-10-CM

## 2011-08-22 DIAGNOSIS — M25569 Pain in unspecified knee: Secondary | ICD-10-CM | POA: Insufficient documentation

## 2011-08-22 DIAGNOSIS — R262 Difficulty in walking, not elsewhere classified: Secondary | ICD-10-CM | POA: Insufficient documentation

## 2011-08-22 DIAGNOSIS — IMO0001 Reserved for inherently not codable concepts without codable children: Secondary | ICD-10-CM | POA: Insufficient documentation

## 2011-08-22 DIAGNOSIS — M6281 Muscle weakness (generalized): Secondary | ICD-10-CM | POA: Insufficient documentation

## 2011-08-22 DIAGNOSIS — M25669 Stiffness of unspecified knee, not elsewhere classified: Secondary | ICD-10-CM | POA: Insufficient documentation

## 2011-08-22 NOTE — Progress Notes (Signed)
Physical Therapy Treatment Patient Details  Name: William Haley MRN: [id number] Date of Birth: [birth year]  Today's Date: 08/22/2011 Time: 1300-1400 Time Calculation (min): 60 min Visit#: 12  of 24   Re-eval: 09/12/11  Charge: therex 42 min Ice 10 min  Subjective: Symptoms/Limitations Symptoms: No pain just a little stiff today. Pain Assessment Currently in Pain?: No/denies  Objective:   Exercise/Treatments Stretches Active Hamstring Stretch: 2 reps;60 seconds;Limitations Active Hamstring Stretch Limitations: long sit h/s st Knee: Self-Stretch to increase Flexion: 2 reps;60 seconds;Limitations Knee: Self-Stretch Limitations: chair stretch Aerobic Stationary Bike: 8' @ 6.0 Machines for Strengthening Cybex Knee Extension: 3pl 2x 15 L LE only Standing Heel Raises: 15 reps Knee Flexion Limitations: 5# Lateral Step Up: Left;15 reps;Hand Hold: 0;Step Height: 8" Forward Step Up: Left;15 reps;Hand Hold: 0;Step Height: 8" Step Down: Left;15 reps;Hand Hold: 0;Step Height: 6" Wall Squat: 15 reps;10 seconds SLS with Vectors: 5x 15 on foam Supine Heel Slides: 10 reps;Limitations Heel Slides Limitations: prior to PROM Knee Extension: PROM;2 sets;Limitations Knee Extension Limitations: 2x 30"  Knee Flexion: PROM;Limitations Knee Flexion Limitations: 3X30" Prone  Prone Knee Hang: 5 minutes Prone Knee Hang Weights (lbs): 5# with massage   Modalities Modalities: Cryotherapy Cryotherapy Number Minutes Cryotherapy: 10 Minutes Cryotherapy Location: Knee Type of Cryotherapy: Ice pack  Physical Therapy Assessment and Plan PT Assessment and Plan Clinical Impression Statement: Pt making good progress, able to increase time with vector stance and increase height with step up and lateral step up no UE A without difficulty.   PT Plan: Resume gastroc/soleus st and progress to elliptical for aerobic next session.    Goals    Problem List Patient Active Problem List  Diagnoses    . 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 S/P ACL surgery  . S/P left knee arthroscopy  . Abnormality of gait  . Restriction of joint motion  . Muscle weakness (generalized)  . S/P arthroscopy of left knee    PT - End of Session Activity Tolerance: Patient tolerated treatment well General Behavior During Session: [facility] for tasks performed Cognition: [facility] for tasks performed  [name] 08/22/2011, 2:27 PM

## 2011-08-25 ENCOUNTER — Ambulatory Visit (HOSPITAL_COMMUNITY)
Admission: RE | Admit: 2011-08-25 | Discharge: 2011-08-25 | Disposition: A | Discharge status: Home / Self Care | Payer: 59 | Source: Ambulatory Visit | Attending: Internal Medicine | Admitting: Internal Medicine

## 2011-08-25 DIAGNOSIS — M6281 Muscle weakness (generalized): Secondary | ICD-10-CM

## 2011-08-25 DIAGNOSIS — R269 Unspecified abnormalities of gait and mobility: Secondary | ICD-10-CM

## 2011-08-25 NOTE — Progress Notes (Signed)
Physical Therapy Treatment Patient Details  Name: BRANDAN ROBICHEAUX MRN: [id number] Date of Birth: 12/23/1970  Today's Date: 08/25/2011 Time: 4098-1191 Time Calculation (min): 52 min Charges: 10 ice, 5 massage, 37 TE  Visit#: 13  of 24   Re-eval: 09/12/11    Subjective: Symptoms/Limitations Symptoms: Did a lot of yard work yesterday and is stiff and sore medially` Pain Assessment Currently in Pain?: Yes Pain Score:   4 Pain Location: Knee Pain Orientation: Left;Medial Pain Type: Acute pain  Exercise/Treatments Stretches Gastroc Stretch: 2 reps;30 seconds Soleus Stretch: 2 reps;30 seconds Aerobic Stationary Bike: 8' @ 4.0 Machines for Strengthening Cybex Knee Extension: 3pl 2x 15 L LE only Standing Heel Raises Limitations: L only 15X, on box B 10X Knee Flexion: 15 reps;Limitations Knee Flexion Limitations: 5# SLS with Vectors: 5x 15 on foam Supine Knee Extension: PROM;2 sets;Limitations Knee Extension Limitations: 3x 30"  Knee Flexion: PROM;Limitations Knee Flexion Limitations: 3X30" Prone  Prone Knee Hang: 5 minutes Prone Knee Hang Weights (lbs): 5# with massage  Modalities Modalities: Cryotherapy Manual Therapy Manual Therapy: Massage Massage: 5' with prone hangs Cryotherapy Number Minutes Cryotherapy: 10 Minutes Cryotherapy Location: Knee Type of Cryotherapy: Ice pack  Physical Therapy Assessment and Plan Clinical Impression Statement: The patient is progressing well with new ther ex.  Has so many now we will have to alternate some every other session PT Plan: Start with Elliptical next session, do rebounder on foam insead of vectors, add functional squats pick up ball from 4" step, increase prone hangs to 7 #, add bil leg hanstring curls on cybex machine.      Problem List Patient Active Problem List  Diagnoses  . 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 S/P ACL surgery  . S/P left knee arthroscopy  .  Abnormality of gait  . Restriction of joint motion  . Muscle weakness (generalized)  . S/P arthroscopy of left knee   PT - End of Session Activity Tolerance: Patient tolerated treatment well General Behavior During Session: [facility] for tasks performed Cognition: [facility] for tasks performed PT Plan of Care PT Home Exercise Plan: no new  Ariyonna Twichell B. Taeshawn Helfman, PT, DPT  08/25/2011, 12:47 PM

## 2011-08-26 ENCOUNTER — Emergency Department (HOSPITAL_COMMUNITY)
Admission: EM | Admit: 2011-08-26 | Discharge: 2011-08-26 | Disposition: A | Discharge status: Home / Self Care | Payer: 59 | Attending: Emergency Medicine | Admitting: Emergency Medicine

## 2011-08-26 ENCOUNTER — Encounter (HOSPITAL_COMMUNITY): Payer: Self-pay | Admitting: Emergency Medicine

## 2011-08-26 DIAGNOSIS — K5289 Other specified noninfective gastroenteritis and colitis: Secondary | ICD-10-CM | POA: Insufficient documentation

## 2011-08-26 DIAGNOSIS — K529 Noninfective gastroenteritis and colitis, unspecified: Secondary | ICD-10-CM

## 2011-08-26 MED ORDER — KETOROLAC TROMETHAMINE 30 MG/ML IJ SOLN
30.0000 mg | Freq: Once | INTRAMUSCULAR | Status: AC
  Administered 2011-08-26: 30 mg via INTRAVENOUS
  Filled 2011-08-26: qty 1

## 2011-08-26 MED ORDER — PROMETHAZINE HCL 25 MG PO TABS: 25.0000 mg | ORAL_TABLET | Freq: Four times a day (QID) | ORAL | Status: DC | PRN

## 2011-08-26 MED ORDER — SODIUM CHLORIDE 0.9 % IV SOLN
Freq: Once | INTRAVENOUS | Status: AC
  Administered 2011-08-26: 09:00:00 via INTRAVENOUS

## 2011-08-26 MED ORDER — WHITE PETROLATUM GEL
Status: AC
  Administered 2011-08-26: 09:00:00
  Filled 2011-08-26: qty 5

## 2011-08-26 MED ORDER — PROMETHAZINE HCL 25 MG/ML IJ SOLN
12.5000 mg | Freq: Once | INTRAMUSCULAR | Status: AC
  Administered 2011-08-26: 25 mg via INTRAVENOUS
  Filled 2011-08-26: qty 1

## 2011-08-26 NOTE — ED Notes (Signed)
2nd liter NS hung

## 2011-08-26 NOTE — ED Notes (Signed)
D/c'd via w/c -- spouse driving.

## 2011-08-26 NOTE — ED Provider Notes (Signed)
History     CSN: [id number]  Arrival date & time 08/26/11  0716   First MD Initiated Contact with Patient 08/26/11 0730      Chief Complaint  Patient presents with  . Emesis    (Consider location/radiation/quality/duration/timing/severity/associated sxs/prior treatment) Patient is a 41 y.o. male presenting with vomiting. The history is provided by the patient.  Emesis  This is a new problem. The current episode started yesterday. The problem occurs 5 to 10 times per day. The problem has been gradually worsening. The emesis has an appearance of stomach contents. There has been no fever. Associated symptoms include abdominal pain and diarrhea. Risk factors include ill contacts (daughter, 3, in day care with same.).    History reviewed. No pertinent past medical history.  Past Surgical History  Procedure Date  . Left knee 1989   . Knee surgery left knee 1989  . Knee arthroscopy 1989    left     Family History  Problem Relation Age of Onset  . Cancer    . Anesthesia problems Neg Hx   . Hypotension Neg Hx   . Malignant hyperthermia Neg Hx   . Pseudochol deficiency Neg Hx     History  Substance Use Topics  . Smoking status: Never Smoker   . Smokeless tobacco: Not on file  . Alcohol Use: No      Review of Systems  Gastrointestinal: Positive for vomiting, abdominal pain and diarrhea.  All other systems reviewed and are negative.    Allergies  Review of patient's allergies indicates no known allergies.  Home Medications   Current Outpatient Rx  Name Route Sig Dispense Refill  . ALIGN PO CAPS Oral Take 1 capsule by mouth daily.    . OMEGA-3 FATTY ACIDS 1000 MG PO CAPS Oral Take 2 g by mouth daily.    Marland Kitchen GLUCOSAMINE-CHONDROITIN 500-400 MG PO TABS Oral Take 1 tablet by mouth 3 (three) times daily.    . IBUPROFEN 800 MG PO TABS Oral Take 800 mg by mouth every 8 (eight) hours as needed.    . ADULT MULTIVITAMIN W/MINERALS CH Oral Take 1 tablet by mouth daily.       BP 103/65  Pulse 110  Temp 98.4 F (36.9 C)  Resp 20  SpO2 97%  Physical Exam  Nursing note and vitals reviewed. Constitutional: He is oriented to person, place, and time. He appears well-developed and well-nourished. No distress.  HENT:  Head: Normocephalic and atraumatic.  Mouth/Throat: Oropharynx is clear and moist.  Neck: Normal range of motion. Neck supple.  Cardiovascular: Normal rate and regular rhythm.  Exam reveals no friction rub.   No murmur heard. Pulmonary/Chest: Effort normal and breath sounds normal. No respiratory distress.  Abdominal: Soft. Bowel sounds are normal. He exhibits no distension. There is no tenderness.  Musculoskeletal: Normal range of motion. He exhibits no edema.  Neurological: He is alert and oriented to person, place, and time.  Skin: Skin is warm and dry. He is not diaphoretic.    ED Course  Procedures (including critical care time)  Labs Reviewed - No data to display No results found.   No diagnosis found.    MDM  History, exam all consistent with gastroenteritis.  Has received fluids, meds.  Will discharge to home with phenergan for nausea, follow prn if worsens.        Geoffery Lyons, MD 08/26/11 [phone]

## 2011-08-26 NOTE — ED Notes (Signed)
Pt reports vomiting/diarrhea since last night, abdominal pain7/10.

## 2011-08-27 ENCOUNTER — Ambulatory Visit (HOSPITAL_COMMUNITY)
Admission: RE | Admit: 2011-08-27 | Discharge: 2011-08-27 | Disposition: A | Discharge status: Home / Self Care | Payer: 59 | Source: Ambulatory Visit | Attending: Internal Medicine | Admitting: Internal Medicine

## 2011-08-27 DIAGNOSIS — R269 Unspecified abnormalities of gait and mobility: Secondary | ICD-10-CM

## 2011-08-27 DIAGNOSIS — M6281 Muscle weakness (generalized): Secondary | ICD-10-CM

## 2011-08-27 NOTE — Progress Notes (Signed)
Physical Therapy Treatment Patient Details  Name: William Haley MRN: [id number] Date of Birth: 12-Apr-1971  Today's Date: 08/27/2011 Time: 4098-1191 Time Calculation (min): 55 min Charges: 10 ice, 10 neuro, 35 TE Visit#: 14  of 24   Re-eval: 09/12/11   Subjective: Symptoms/Limitations Symptoms: posterior aspect of knee is sore, hamstring area.   Pain Assessment Currently in Pain?: Yes Pain Score:   5 Pain Location: Knee Pain Orientation: Left;Posterior Pain Type: Surgical pain  Exercise/Treatments Stretches Passive Hamstring Stretch: 2 reps;30 seconds Knee: Self-Stretch to increase Flexion: 60 seconds;Limitations;3 reps Knee: Self-Stretch Limitations: kneeling on edge of table Gastroc Stretch: 2 reps;30 seconds Soleus Stretch: 2 reps;30 seconds Aerobic Elliptical: 6' @ 1.0 Machines for Strengthening Cybex Knee Extension: 3pl 2x 15 L LE only Cybex Knee Flexion: 6Pl bil 2x 10 Standing Heel Raises Limitations: L only 15X, on box B 10X Lateral Step Up: Left;15 reps;Hand Hold: 0;Step Height: 8" Forward Step Up: Left;15 reps;Hand Hold: 0;Step Height: 8" Step Down: Left;15 reps;Hand Hold: 0;Step Height: 6" Functional Squat: 2 sets;10 reps;Limitations Functional Squat Limitations: to 6" step with blue ball.   Rocker Board: 2 minutes;Limitations Rocker Board Limitations: trying to keep the board parallel to the ground.  L/R Rebounder: on foam with green ball left leg only Supine Knee Extension: PROM;2 sets;Limitations Knee Extension Limitations: 3x 30"  Prone  Prone Knee Hang: 5 minutes Prone Knee Hang Weights (lbs): 7# with massage Other Prone Exercises: PROM knee flexion 3 x 30"  Modalities Modalities: Cryotherapy Cryotherapy Number Minutes Cryotherapy: 10 Minutes Cryotherapy Location: Knee Type of Cryotherapy: Ice pack  Physical Therapy Assessment and Plan Clinical Impression Statement: The patient tolerated new ther ex well.  Pain with end ROM more in flexion  than extension.   PT Plan: Continue with current POC, add lunge walking 2 RT, increase step up reps to 20 each, d/c/ knee flexion in standing since he is doing the cybex hamstring now, add side stepping squats with T-band 2 RT.      Problem List Patient Active Problem List  Diagnoses  . 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 S/P ACL surgery  . S/P left knee arthroscopy  . Abnormality of gait  . Restriction of joint motion  . Muscle weakness (generalized)  . S/P arthroscopy of left knee   PT - End of Session Activity Tolerance: Patient tolerated treatment well General Behavior During Session: [facility] for tasks performed Cognition: [facility] for tasks performed PT Plan of Care PT Home Exercise Plan: no new Consulted and Agree with Plan of Care: Patient  Rollene Rotunda. Vercie Pokorny, PT, DPT  08/27/2011, 12:32 PM

## 2011-08-29 ENCOUNTER — Ambulatory Visit (HOSPITAL_COMMUNITY)
Admission: RE | Admit: 2011-08-29 | Discharge: 2011-08-29 | Disposition: A | Discharge status: Home / Self Care | Payer: 59 | Source: Ambulatory Visit

## 2011-08-29 DIAGNOSIS — M6281 Muscle weakness (generalized): Secondary | ICD-10-CM

## 2011-08-29 DIAGNOSIS — R269 Unspecified abnormalities of gait and mobility: Secondary | ICD-10-CM

## 2011-08-29 NOTE — Progress Notes (Signed)
Physical Therapy Treatment Patient Details  Name: William Haley MRN: [id number] Date of Birth: 1970-08-19  Today's Date: 08/29/2011 Time: 4098-1191 Time Calculation (min): 52 min Visit#: 15  of 24   Re-eval: 09/12/11  Charge: therex 42 min  Subjective: Symptoms/Limitations Symptoms: No pain just really tight L hamstring area Pain Assessment Currently in Pain?: No/denies  Objective:   Exercise/Treatments Stretches Active Hamstring Stretch: 3 reps;30 seconds Active Hamstring Stretch Limitations: with rope Knee: Self-Stretch to increase Flexion: 60 seconds;Limitations;3 reps Knee: Self-Stretch Limitations: kneeling on edge of table Gastroc Stretch: 2 reps;30 seconds Soleus Stretch: 2 reps;30 seconds Machines for Strengthening Cybex Knee Extension: 3.5 pl 2x 15 L LE only Cybex Knee Flexion: 6.5 Pl bil 2x 15 Standing Lateral Step Up: Left;20 reps;Hand Hold: 0;Step Height: 8" Forward Step Up: Left;20 reps;Hand Hold: 0;Step Height: 8" Step Down: Left;15 reps;Hand Hold: 0;Step Height: 8" Functional Squat: Limitations Functional Squat Limitations: 2RT side step squat with blue tband Lunge Walking - Round Trips: 2 RT Supine Heel Slides: 15 reps;Limitations Heel Slides Limitations: prior to PROM Knee Extension: PROM;3 sets;Limitations Knee Extension Limitations: 3x 30" Knee Flexion: PROM;3 sets;Limitations Knee Flexion Limitations: 3x 30"   Modalities Modalities: Cryotherapy Cryotherapy Number Minutes Cryotherapy: 10 Minutes Cryotherapy Location: Knee Type of Cryotherapy: Ice pack  Physical Therapy Assessment and Plan PT Assessment and Plan Clinical Impression Statement: Added new therex per PT plan.  Able to increase reps with step up/lateral step ups, increase weight with cybex and with prone knee hang, , pt tolerated total treatment well with no c/o. PT Plan: Continue with current POC.      Goals    Problem List Patient Active Problem List  Diagnoses  .  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 S/P ACL surgery  . S/P left knee arthroscopy  . Abnormality of gait  . Restriction of joint motion  . Muscle weakness (generalized)  . S/P arthroscopy of left knee    PT - End of Session Activity Tolerance: Patient tolerated treatment well General Behavior During Session: [facility] for tasks performed Cognition: [facility] for tasks performed  [name] 08/29/2011, 3:56 PM

## 2011-09-01 ENCOUNTER — Ambulatory Visit (HOSPITAL_COMMUNITY)
Admission: RE | Admit: 2011-09-01 | Discharge: 2011-09-01 | Disposition: A | Discharge status: Home / Self Care | Payer: 59 | Source: Ambulatory Visit

## 2011-09-01 DIAGNOSIS — R269 Unspecified abnormalities of gait and mobility: Secondary | ICD-10-CM

## 2011-09-01 DIAGNOSIS — M6281 Muscle weakness (generalized): Secondary | ICD-10-CM

## 2011-09-01 NOTE — Evaluation (Signed)
Physical Therapy Re-Evaluation  Patient Details  Name: William Haley MRN: [id number] Date of Birth: 05-08-1971  Today's Date: 09/01/2011 Time: 7829-5621 Time Calculation (min): 54 min Charge: 1 re-eval, 10 gait, 29 TE Visit#: 16  of 24   Re-eval: 09/12/11   Past Surgical History:  Past Surgical History  Procedure Date  . Left knee 1989   . Knee surgery left knee 1989  . Knee arthroscopy 1989    left    Subjective Symptoms/Limitations Symptoms: "People say I'm still walking funny" per patient report.  When he does lock it out there is some pain medially.   How long can you sit comfortably?: not limited How long can you stand comfortably?: not limited How long can you walk comfortably?: not limited.   Special Tests: limited in walking pattern still,  Pain Assessment Currently in Pain?: No/denies Pain Score: 0-No pain Pain Location: Knee Pain Orientation: Left;Medial Pain Type: Surgical pain  Sensation/Coordination/Flexibility/Functional Tests: previously filed values = ( ) Functional Tests Functional Tests: 1 RM R: 107.5, L  82.5 (76%), (66% last measured)  Assessment LLE AROM (degrees) Left Knee Extension 0-130: 0 (1) Left Knee Flexion 0-140: 115 (109) LLE PROM (degrees) Left Knee Extension 0-130: 0 (0) Left Knee Flexion 0-140: 118 (116) LLE Strength Left Hip ABduction: 5/5 (4/5) Left Knee Flexion: 5/5 (4/5)  Exercise/Treatments Stretches Knee: Self-Stretch to increase Flexion: Limitations;3 reps;30 seconds Knee: Self-Stretch Limitations: kneeling on edge of table 3 x 30" Aerobic Elliptical: 6' @ 3.0 Machines for Strengthening Cybex Knee Extension: 3.5 pl 2x 15 L LE only Cybex Knee Flexion: 6.5 Pl bil 2x 15 Standing Lateral Step Up: Left;20 reps;Hand Hold: 0;Step Height: 8" Forward Step Up: Left;20 reps;Hand Hold: 0;Step Height: 8" Step Down: Left;15 reps;Hand Hold: 0;Step Height: 8" Functional Squat Limitations: 3RT side step squat with blue  tband Lunge Walking - Round Trips: 3 RT Gait Training: in hallway, working on speed, quality and backwards walking with TKE during toe contact backwards walking.   Supine Knee Extension: PROM;3 sets;Limitations Knee Extension Limitations: 3x 30" Prone  Prone Knee Hang: 5 minutes Prone Knee Hang Weights (lbs): 10# Other Prone Exercises: PROM knee flexion 3 x 30"  Physical Therapy Assessment and Plan Clinical Impression Statement: Re-eval performed.  Patient's strength is no WFL except he is only 76% quad strength L vs. R when measured with 1 RM.  His ROM is also lacking in left knee extension and flexion at end ROM.  His gait pattern is also still altered.  The patient has me 1/4  STGs and  1/3 LTGs.   Rehab Potential: Good PT Frequency: Min 2X/week PT Duration: Other (comment) (2 more weeks) PT Treatment/Interventions: Gait training;Functional mobility training;Therapeutic activities;Therapeutic exercise;Balance training;Neuromuscular re-education;Patient/family education;Other (comment) (modalities and STM as needed for pain, joint mobs for ROM) PT Plan: continue 2xs/wk x 2 more weeks, with higher level body-weight resistance activities.  Continue gait training in hallway working on increased and decreased speed, forward and backward walking (focusing TKE with toe contact with backwards walking).      Goals Home Exercise Program Pt will Perform Home Exercise Program: Independently PT Goal: Perform Home Exercise Program - Progress: Met PT Short Term Goals Time to Complete Short Term Goals: 4 weeks PT Short Term Goal 1: Increase knee ROM to 0-125 to improve symmetry and ability to perform daily activities  PT Short Term Goal 1 - Progress: Progressing toward goal PT Short Term Goal 2: Patient will demonstrate normal gait pattern on indoor and outdoor  surfaces without an assistive device  PT Short Term Goal 2 - Progress: Progressing toward goal PT Short Term Goal 3: The patient will have  decreased knee edea within 10% of his other leg for improved edema management and general comfort of left knee. PT Short Term Goal 3 - Progress: Progressing toward goal (not tested today, but last measures were progressing) PT Long Term Goals Time to Complete Long Term Goals: 4 weeks PT Long Term Goal 1: The patient will be able to perform I RM quad/hamstring that will meet the 80% ratio to show readiness to return to sporting activities  PT Long Term Goal 1 - Progress: Progressing toward goal (very close (76%)) PT Long Term Goal 2: The patient will demonstrate 5/5 knee flex/ext strength to show improved mobility and strength  PT Long Term Goal 2 - Progress: Met Long Term Goal 3: Knee flexion ROM L = R to show improved symmetry and ROM  Long Term Goal 3 Progress: Progressing toward goal  Problem List Patient Active Problem List  Diagnoses  . 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 S/P ACL surgery  . S/P left knee arthroscopy  . Abnormality of gait  . Restriction of joint motion  . Muscle weakness (generalized)  . S/P arthroscopy of left knee   PT - End of Session Activity Tolerance: Patient tolerated treatment well General Behavior During Session: [facility] for tasks performed Cognition: [facility] for tasks performed PT Plan of Care PT Home Exercise Plan: Elliptical during lunch at work to decrease knee stiffness and help decrease circumduction gait.  [facility].   Consulted and Agree with Plan of Care: Patient  Rollene Rotunda. Nahla Lukin, PT, DPT  09/01/2011, 6:00 PM  Physician Documentation Your signature is required to indicate approval of the treatment plan as stated above.  Please sign and either send electronically or make a copy of this report for your files and return this physician signed original.   Please mark one 1.__approve of plan  2. ___approve of plan with the following  conditions.   ______________________________                                                          _____________________ Physician Signature                                                                                                             Date

## 2011-09-03 ENCOUNTER — Encounter: Payer: Self-pay | Admitting: Orthopedic Surgery

## 2011-09-03 ENCOUNTER — Ambulatory Visit (HOSPITAL_COMMUNITY)
Admission: RE | Admit: 2011-09-03 | Discharge: 2011-09-03 | Disposition: A | Discharge status: Home / Self Care | Payer: 59 | Source: Ambulatory Visit

## 2011-09-03 ENCOUNTER — Ambulatory Visit (INDEPENDENT_AMBULATORY_CARE_PROVIDER_SITE_OTHER): Payer: 59 | Admitting: Orthopedic Surgery

## 2011-09-03 DIAGNOSIS — Z9889 Other specified postprocedural states: Secondary | ICD-10-CM

## 2011-09-03 DIAGNOSIS — M2350 Chronic instability of knee, unspecified knee: Secondary | ICD-10-CM

## 2011-09-03 DIAGNOSIS — R269 Unspecified abnormalities of gait and mobility: Secondary | ICD-10-CM

## 2011-09-03 DIAGNOSIS — M238X9 Other internal derangements of unspecified knee: Secondary | ICD-10-CM

## 2011-09-03 DIAGNOSIS — M6281 Muscle weakness (generalized): Secondary | ICD-10-CM

## 2011-09-03 NOTE — Progress Notes (Signed)
Patient ID: William Haley, male   DOB: Sep 20, 1970, 41 y.o.   MRN: [id number] Chief Complaint  Patient presents with  . Follow-up    3 week recheck Lt knee, post op, DOS 07/25/11    Continued improvement of the LEFT knee with slight loss of extension.  His flexion to me seems only 112 115.  Pain is resolved  Knee is stable  Recommend continued exercise with therapy then transition to a home exercise program followup in one month

## 2011-09-03 NOTE — Patient Instructions (Signed)
Home exercises   Ok to golf

## 2011-09-03 NOTE — Progress Notes (Signed)
Physical Therapy Treatment Patient Details  Name: William Haley MRN: [id number] Date of Birth: 13-Jun-1971  Today's Date: 09/03/2011 Time: 6578-4696 Time Calculation (min): 56 min Visit#: 17  of 24   Re-eval: 09/12/11  Charge: therex 38 min Gait 12 min   Subjective: Symptoms/Limitations Symptoms: MD happy with my progress, he said I can be done with OPPT following next Friday. [facility].  No pain today Pain Assessment Currently in Pain?: No/denies  Objective:   Exercise/Treatments Stretches Knee: Self-Stretch to increase Flexion: Limitations;3 reps;30 seconds Knee: Self-Stretch Limitations: kneeling on edge of table 3 x 30" Aerobic Elliptical: 6' @ L3 Machines for Strengthening Cybex Knee Extension: 3.5 pl 2x 15 L LE only Cybex Knee Flexion: 7.5 Pl bil 2x 15 Standing Lateral Step Up: Left;20 reps;Hand Hold: 0;Step Height: 8" Forward Step Up: Left;20 reps;Hand Hold: 0;Step Height: 8" Step Down: Left;15 reps;Hand Hold: 0;Step Height: 8" Functional Squat: Limitations Functional Squat Limitations: 3RT side step squat with blue tband Lunge Walking - Round Trips: 3 RT Gait Training: in hallway, working on speed, quality and backwards walking with TKE during toe contact backwards walking. x 12 min Supine Heel Slides: 10 reps;Limitations Heel Slides Limitations: prior to PROM Knee Extension: PROM;3 sets;Limitations Knee Extension Limitations: 3x 30" Knee Flexion: PROM;3 sets;Limitations Knee Flexion Limitations: 3x 30" Prone  Prone Knee Hang: 5 minutes Prone Knee Hang Weights (lbs): 10# Other Prone Exercises: PROM knee flexion 3 x 30"      Physical Therapy Assessment and Plan PT Assessment and Plan Clinical Impression Statement: Pt tolerated well towards total session.  Able to increase weight with cybex hamstring start position 8 for terminal knee flexion strength.  Gait continues to be impaired due to lacking terminal knee  extension PT Plan: Continue x 4 more session then D/C per MD.  Continue gait training focusing on TKE and overall functional strengthening.    Goals    Problem List Patient Active Problem List  Diagnoses  . 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 S/P ACL surgery  . S/P left knee arthroscopy  . Abnormality of gait  . Restriction of joint motion  . Muscle weakness (generalized)  . S/P arthroscopy of left knee    PT - End of Session Activity Tolerance: Patient tolerated treatment well General Behavior During Session: [facility] for tasks performed Cognition: [facility] for tasks performed  [name] 09/03/2011, 6:25 PM

## 2011-09-04 ENCOUNTER — Ambulatory Visit (HOSPITAL_COMMUNITY)
Admission: RE | Admit: 2011-09-04 | Discharge: 2011-09-04 | Disposition: A | Discharge status: Home / Self Care | Payer: 59 | Source: Ambulatory Visit | Attending: Internal Medicine | Admitting: Internal Medicine

## 2011-09-04 DIAGNOSIS — R269 Unspecified abnormalities of gait and mobility: Secondary | ICD-10-CM

## 2011-09-04 DIAGNOSIS — M6281 Muscle weakness (generalized): Secondary | ICD-10-CM

## 2011-09-04 NOTE — Progress Notes (Signed)
Physical Therapy Treatment Patient Details  Name: William Haley MRN: [id number] Date of Birth: [birth year]  Today's Date: 09/04/2011 Time: 1600-1705 Time Calculation (min): 65 min Visit#: 18  of 24   Re-eval: 09/12/11  Charge: therex 49 min Ice 10 min  Subjective: Symptoms/Limitations Symptoms: No pain today, those hamstrings feel really tight, feel like thats what makes me walk funny. Pain Assessment Currently in Pain?: No/denies  Objective:   Exercise/Treatments Stretches Active Hamstring Stretch: 3 reps;30 seconds Active Hamstring Stretch Limitations: with rope Aerobic Elliptical: 6' @ L3 Machines for Strengthening Cybex Knee Extension: 4.5 Pl L LE only 2x 15 Cybex Knee Flexion: 7.5 Pl bil 2x 15 Plyometrics Bilateral Jumping: Limitations;15 reps Bilateral Jumping Limitations: A/P; R/L Broad Jump: Limitations Broad Jump Limitations: 2 RT; able to jump 6.5 ft one jump Standing Lateral Step Up: Left;15 reps;Limitations Lateral Step Up Limitations: BOSU Forward Step Up: Left;15 reps;Limitations Forward Step Up Limitations: BOSU Step Down: Left;15 reps;Limitations Step Down Limitations: BOSU Lunge Walking - Round Trips: 3 RT Seated Other Seated Knee Exercises: long sit h/s st. 2x 1'   Modalities Modalities: Cryotherapy Cryotherapy Number Minutes Cryotherapy: 10 Minutes Cryotherapy Location: Knee Type of Cryotherapy: Ice pack  Physical Therapy Assessment and Plan PT Assessment and Plan Clinical Impression Statement: Progressed therex to BOSU step ups and added plyometrics, pt cautious with new activity but able to complete without compliant. PT Plan: Continue x 3 more sessions the D/C to HEP per MD.  Continue focus on proper gait mechanics and overall functional strengthening.    Goals    Problem List Patient Active Problem List  Diagnoses  . 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 S/P ACL surgery  . S/P  left knee arthroscopy  . Abnormality of gait  . Restriction of joint motion  . Muscle weakness (generalized)  . S/P arthroscopy of left knee    PT - End of Session Activity Tolerance: Patient tolerated treatment well General Behavior During Session: [facility] for tasks performed Cognition: [facility] for tasks performed  William Haley 09/04/2011, 5:13 PM

## 2011-09-05 ENCOUNTER — Ambulatory Visit (HOSPITAL_COMMUNITY): Payer: 59 | Admitting: Physical Therapy

## 2011-09-08 ENCOUNTER — Ambulatory Visit (HOSPITAL_COMMUNITY): Payer: 59 | Admitting: Physical Therapy

## 2011-09-10 ENCOUNTER — Ambulatory Visit (HOSPITAL_COMMUNITY)
Admission: RE | Admit: 2011-09-10 | Discharge: 2011-09-10 | Disposition: A | Discharge status: Home / Self Care | Payer: 59 | Source: Ambulatory Visit

## 2011-09-10 NOTE — Progress Notes (Signed)
Physical Therapy Treatment Patient Details  Name: William Haley MRN: [id number] Date of Birth: 1970/11/22  Today's Date: 09/10/2011 Time: 4098-1191 Time Calculation (min): 53 min Charges: 35' TE, 8' Man, ice Visit#: 19  of 24   Re-eval: 09/12/11    Subjective: Symptoms/Limitations Symptoms: Pt reports that he has been able to play 9 holes of golf without any pain the next day, he still is worried about it when he swings.  he was able to bowl with compensations Pain Assessment Currently in Pain?: No/denies  Exercise/Treatments Stretches Gastroc Stretch: 3 reps;30 seconds Aerobic Elliptical: 6 min 4.0 Machines for Strengthening Cybex Knee Extension: LLE only: 4.0 Pl x12; 4.5 PL x10, 5.0 Pl x8 Cybex Knee Flexion: LLE only3.0 PL x12, 3.5 x10, 4.0 x8 Standing Forward Lunges: Limitations Forward Lunges Limitations: walking with some forward momentum 2 RT; BOSU Up and Over 2x5 Side Lunges: Limitations Side Lunges Limitations: Squats with hip abduction 3RT Rocker Board: Limitations Rocker Board Limitations: [employer] forces technical officer 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  Cryotherapy Number Minutes Cryotherapy: 10 Minutes Cryotherapy Location: Knee Type of Cryotherapy: Ice pack  Physical Therapy Assessment and Plan PT Assessment and Plan Clinical Impression Statement: Pt continues to improve his overall functional strength and ROM.  Added exercises to promote increase in strength and functional mobility with athletic activities.  PT Plan: Re-evaluation next visit.     Problem List Patient Active Problem List  Diagnoses  . 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 S/P ACL surgery  . S/P left knee arthroscopy  . Abnormality of gait  . Restriction of joint  motion  . Muscle weakness (generalized)  . S/P arthroscopy of left knee    PT - End of Session Activity Tolerance: Patient tolerated treatment well   Raveen Wieseler 09/10/2011, 4:49 PM

## 2011-09-12 ENCOUNTER — Ambulatory Visit (HOSPITAL_COMMUNITY)
Admission: RE | Admit: 2011-09-12 | Discharge: 2011-09-12 | Disposition: A | Discharge status: Home / Self Care | Payer: 59 | Source: Ambulatory Visit | Attending: Internal Medicine | Admitting: Internal Medicine

## 2011-09-12 DIAGNOSIS — M6281 Muscle weakness (generalized): Secondary | ICD-10-CM

## 2011-09-12 DIAGNOSIS — R269 Unspecified abnormalities of gait and mobility: Secondary | ICD-10-CM

## 2011-09-12 NOTE — Progress Notes (Signed)
Physical Therapy Treatment Patient Details  Name: William Haley MRN: [id number] Date of Birth: 04-22-1971  Today's Date: 09/12/2011 Time: 9147-8295 Time Calculation (min): 51 min Charges: 30' TE, 12' Man, 10 min ice Visit#: 20  of 24   Re-eval: 09/12/11   Subjective: Symptoms/Limitations Symptoms: Pt reports that he is feeling good after the last session.  Just some general pain and stiffness.  Pain Assessment Currently in Pain?: No/denies  Exercise/Treatments  Stretches Active Hamstring Stretch: 3 reps;30 seconds Active Hamstring Stretch Limitations: with rope Quad Stretch: 3 reps;30 seconds ITB Stretch: 3 reps;30 seconds Aerobic Elliptical: 8 min 3.0 Machines for Strengthening Cybex Knee Extension: LLE only: 4.0 Pl x12; 4.5 PL x10, 5.0 Pl x8 Cybex Knee Flexion: LLE only3.0 PL x12, 3.5 x10, 4.0 x8 Plyometrics Bilateral Jumping: Limitations;15 reps Bilateral Jumping Limitations: A/P; R/L Broad Jump: Limitations Broad Jump Limitations: 2 RT; High Knees 2 RT   Manual Therapy Other Manual Therapy: Grade III-IV joint mobs to L knee in prone position with 10 sec stretch afterwards x12 minutes. Cryotherapy Number Minutes Cryotherapy: 10 Minutes Cryotherapy Location: Knee Type of Cryotherapy: Ice pack  Physical Therapy Assessment and Plan PT Assessment and Plan Clinical Impression Statement: Pt continues to show improvement with his ROM and strength.   PT Plan: RE-EVAL NEXT SESSION    Goals    Problem List Patient Active Problem List  Diagnoses  . 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 S/P ACL surgery  . S/P left knee arthroscopy  . Abnormality of gait  . Restriction of joint motion  . Muscle weakness (generalized)  . S/P arthroscopy of left knee       William Haley 09/12/2011, 4:24 PM

## 2011-10-02 ENCOUNTER — Ambulatory Visit (INDEPENDENT_AMBULATORY_CARE_PROVIDER_SITE_OTHER): Payer: 59 | Admitting: Orthopedic Surgery

## 2011-10-02 ENCOUNTER — Encounter: Payer: Self-pay | Admitting: Orthopedic Surgery

## 2011-10-02 VITALS — BP 116/70 | Ht 70.0 in | Wt 205.0 lb

## 2011-10-02 DIAGNOSIS — M25669 Stiffness of unspecified knee, not elsewhere classified: Secondary | ICD-10-CM

## 2011-10-02 NOTE — Progress Notes (Signed)
Patient ID: William Haley, male   DOB: [birth year], 41 y.o.   MRN: [id number] Chief Complaint  Patient presents with  . 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  Complex tear posterior horn medial meniscus.  Large medial plica  Notch area Anterior cruciate ligament intact with significant scarring anterior to it  Lateral compartment normal  Patellofemoral compartment grade 3 lateral facet And grade 2/3 lesion trochlea.  Exam under anesthesia and normal pivot shift.   Overall doing well does not quite have full range of motion and has some intermittent swelling in the LEFT knee  Today his Lachman test was normal.  No joint effusion.  No real tenderness or crepitance.  Recommend continued therapy at the facility of his choice.  Follow up with [place] as needed  Advised to take ibuprofen as needed and take glucosamine and chondroitin daily which he is already doing  Probably will need some more work in the future. The  Chondral lesions will become an issue.  We asked him to continue to keep his quadriceps strong avoiding deep squats or deep knee bends.

## 2011-10-13 ENCOUNTER — Ambulatory Visit: Payer: 59 | Attending: Orthopedic Surgery | Admitting: Rehabilitative and Restorative Service Providers"

## 2011-10-13 DIAGNOSIS — IMO0001 Reserved for inherently not codable concepts without codable children: Secondary | ICD-10-CM | POA: Insufficient documentation

## 2011-10-13 DIAGNOSIS — R269 Unspecified abnormalities of gait and mobility: Secondary | ICD-10-CM | POA: Insufficient documentation

## 2011-10-13 DIAGNOSIS — M6281 Muscle weakness (generalized): Secondary | ICD-10-CM | POA: Insufficient documentation

## 2011-10-16 ENCOUNTER — Ambulatory Visit: Payer: 59

## 2011-10-20 ENCOUNTER — Ambulatory Visit: Payer: 59 | Attending: Orthopedic Surgery | Admitting: Physical Therapy

## 2011-10-20 DIAGNOSIS — IMO0001 Reserved for inherently not codable concepts without codable children: Secondary | ICD-10-CM | POA: Insufficient documentation

## 2011-10-20 DIAGNOSIS — M6281 Muscle weakness (generalized): Secondary | ICD-10-CM | POA: Insufficient documentation

## 2011-10-20 DIAGNOSIS — R269 Unspecified abnormalities of gait and mobility: Secondary | ICD-10-CM | POA: Insufficient documentation

## 2011-10-22 ENCOUNTER — Ambulatory Visit: Payer: 59 | Admitting: Rehabilitative and Restorative Service Providers"

## 2011-11-03 ENCOUNTER — Ambulatory Visit: Payer: 59 | Admitting: Rehabilitative and Restorative Service Providers"

## 2011-11-05 ENCOUNTER — Encounter: Payer: 59 | Admitting: Rehabilitative and Restorative Service Providers"

## 2011-11-10 ENCOUNTER — Encounter: Payer: 59 | Admitting: Rehabilitative and Restorative Service Providers"

## 2012-01-13 ENCOUNTER — Ambulatory Visit (INDEPENDENT_AMBULATORY_CARE_PROVIDER_SITE_OTHER): Payer: 59 | Admitting: Urology

## 2012-01-13 DIAGNOSIS — N469 Male infertility, unspecified: Secondary | ICD-10-CM

## 2012-03-02 ENCOUNTER — Encounter: Payer: Self-pay | Admitting: Orthopedic Surgery

## 2012-03-02 ENCOUNTER — Ambulatory Visit (INDEPENDENT_AMBULATORY_CARE_PROVIDER_SITE_OTHER): Payer: 59 | Admitting: Orthopedic Surgery

## 2012-03-02 VITALS — BP 122/80 | Ht 70.0 in | Wt 205.0 lb

## 2012-03-02 DIAGNOSIS — M25462 Effusion, left knee: Secondary | ICD-10-CM | POA: Insufficient documentation

## 2012-03-02 DIAGNOSIS — M25469 Effusion, unspecified knee: Secondary | ICD-10-CM

## 2012-03-02 NOTE — Progress Notes (Signed)
Subjective:     Patient ID: William Haley, male   DOB: 03-01-1971, 41 y.o.   MRN: [id number] Chief Complaint  Patient presents with  . Knee Problem    Left knee swelling, DOS 07/25/11    HPI  The patient is about 7 months status post arthroscopy, LEFT knee, where we found arthroscopic findings of arthritis and he also torn medial meniscus, and intact. ACL with anterior scarring. He also complains of swelling and fullness in the LEFT knee. He's been able to exercise, but wonders if this is normal.  He denies any catching, locking, or giving way Review of Systems     Objective:   Physical Exam Right Knee Exam  Right knee exam is normal.  Tenderness  The patient is experiencing no tenderness.     Range of Motion  The patient has normal right knee ROM.  Muscle Strength   The patient has normal right knee strength.   Left Knee Exam   Tenderness  The patient is experiencing no tenderness.     Range of Motion  Extension: 0  Left knee flexion: 125.   Muscle Strength   The patient has normal left knee strength.  Tests  McMurray:  Medial - negative Lateral - negative Lachman:  Anterior - negative    Posterior - negative Drawer:       Anterior - negative     Posterior - negative Varus: positive Valgus: negative Patellar Apprehension: negative  Other  Scars: present Sensation: normal Pulse: present Left knee swelling: there is a trace positive effusion, and synovial hypertrophy in irritation with bogginess noted medially and laterally and in the retropatellar region.        Assessment:     Synovial irritation, hypertrophy, and bogginess.    Plan:     Recommend injection, call in one month if still having swelling  Knee  Injection Procedure Note  Pre-operative Diagnosis: left knee oa  Post-operative Diagnosis: same  Indications: pain  Anesthesia: ethyl chloride   Procedure Details   Verbal consent was obtained for the procedure. Time out was  completed.The joint was prepped with alcohol, followed by  Ethyl chloride spray and A 20 gauge needle was inserted into the knee via lateral approach; 4ml 1% lidocaine and 1 ml of depomedrol  was then injected into the joint . The needle was removed and the area cleansed and dressed.  Complications:  None; patient tolerated the procedure well.

## 2012-03-02 NOTE — Patient Instructions (Addendum)
You have received a steroid shot. 15% of patients experience increased pain at the injection site with in the next 24 hours. This is best treated with ice and tylenol extra strength 2 tabs every 8 hours. If you are still having pain please call the office.   Call us if needed in 1 mo

## 2012-06-06 IMAGING — US US EXTREM LOW VENOUS BILAT
1 series · 14 of 24 positions shown · non-contrast
Comparison: None.

CLINICAL DATA: Leg pain.

VENOUS DUPLEX ULTRASOUND OF BILATERAL LOWER EXTREMITIES
TECHNIQUE: Gray-scale sonography with graded compression, as well
as color Doppler and duplex ultrasound, were performed to evaluate
the deep venous system of both lower extremities from the level of
the common femoral vein through the popliteal and proximal calf
veins.  Spectral Doppler was utilized to evaluate flow at rest and
with distal augmentation maneuvers.

[Series 1: us extrem low venous bilat · 14 of 54 slices shown]
[im 1/54]
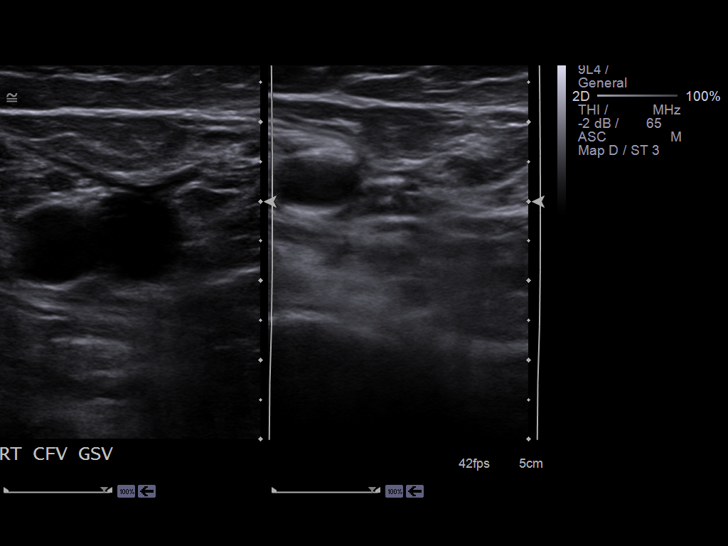
[im 5/54]
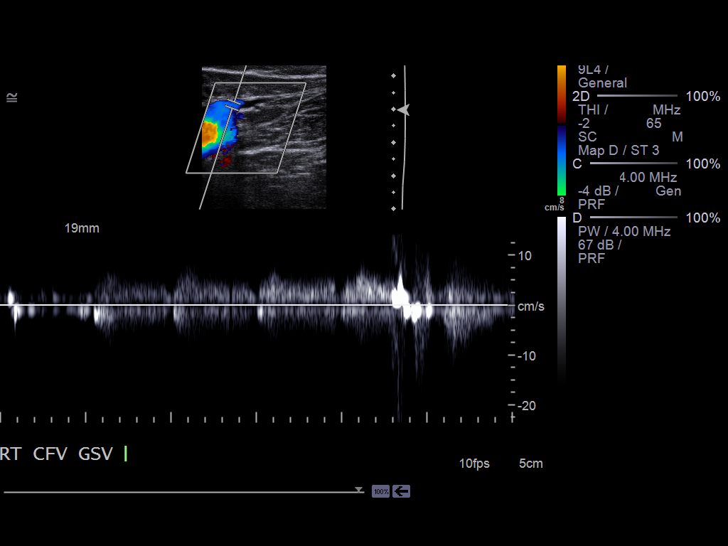
[im 10/54]
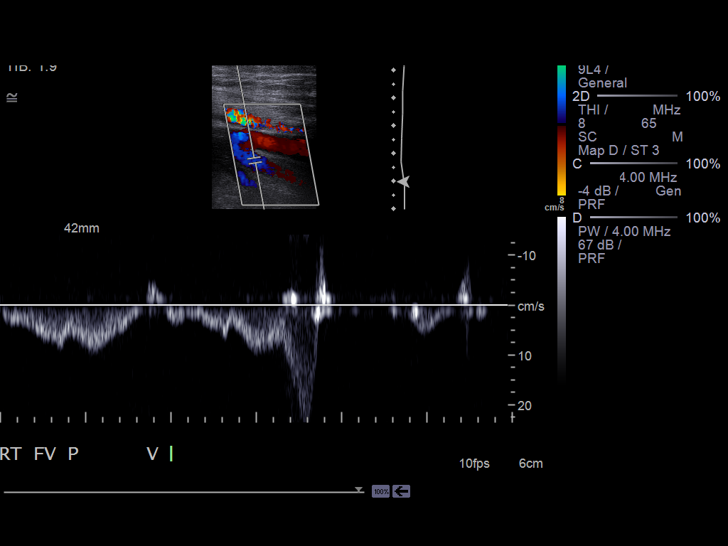
[im 14/54]
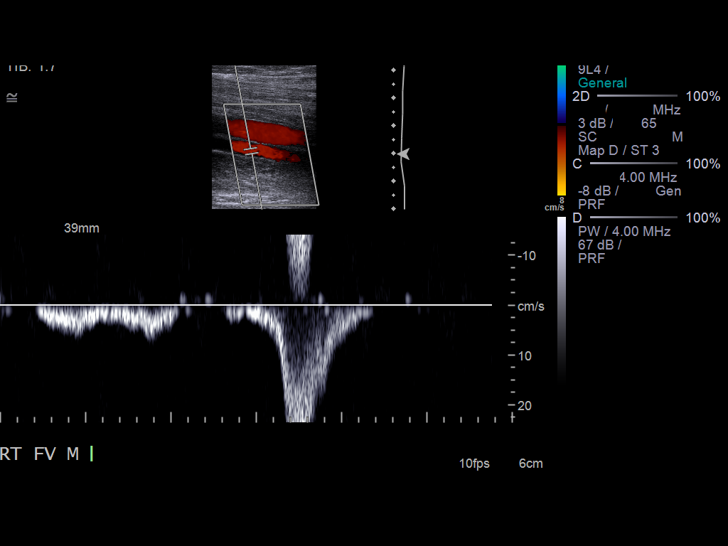
[im 17/54]
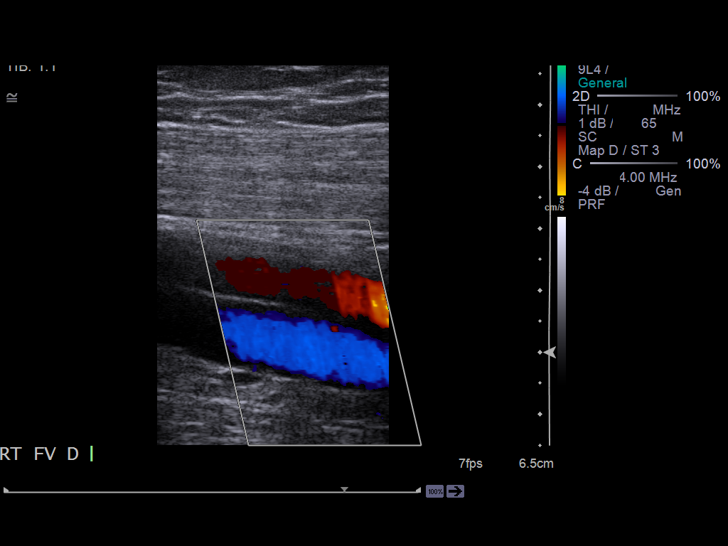
[im 21/54]
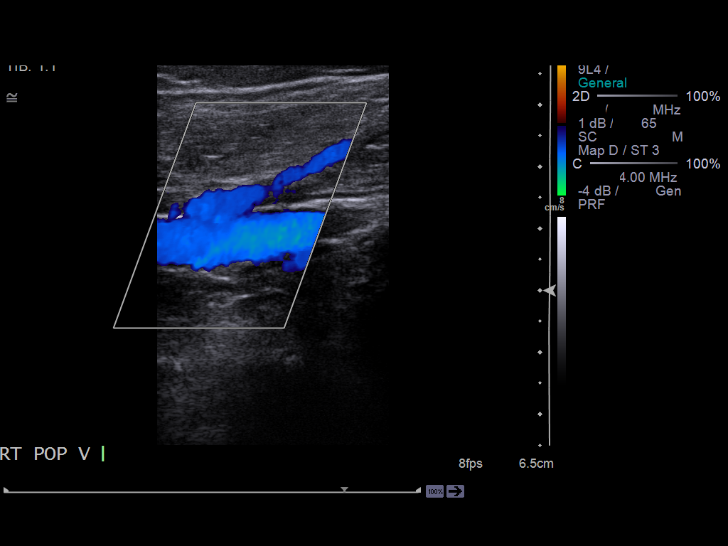
[im 26/54]
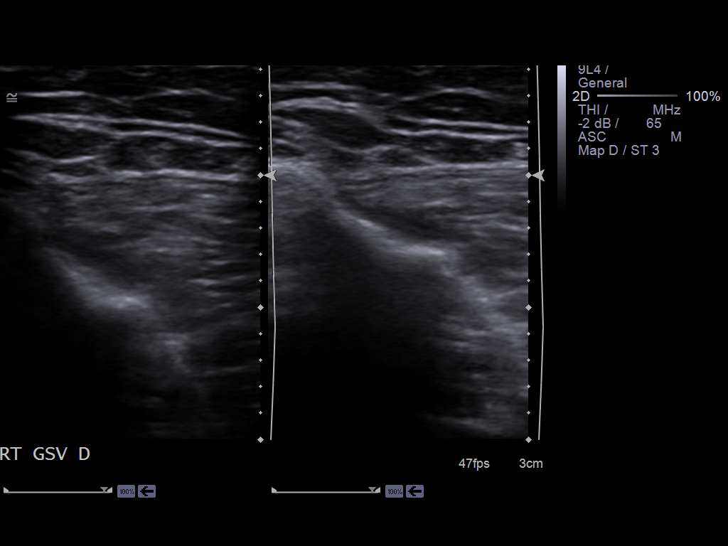
[im 28/54]
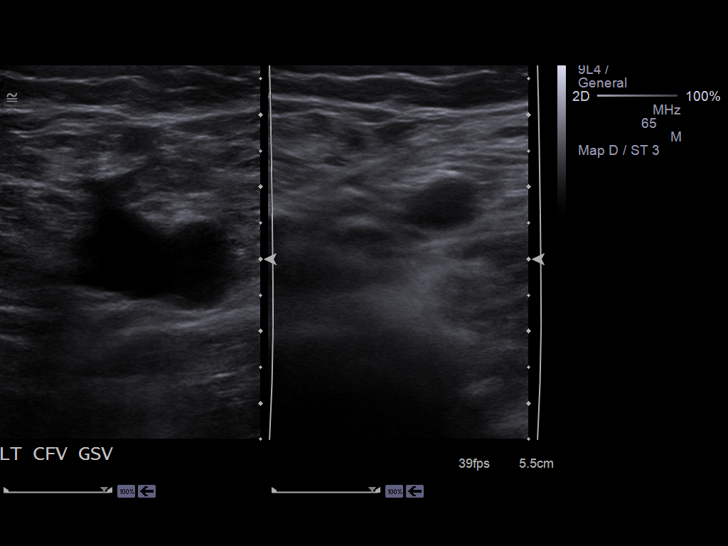
[im 33/54]
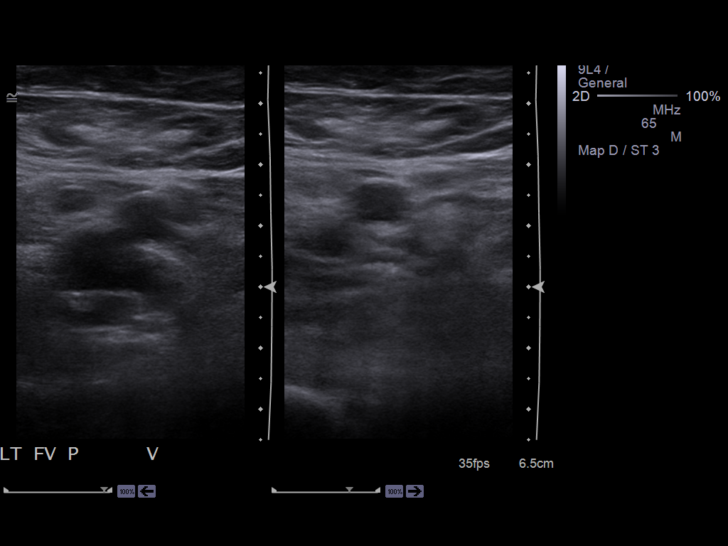
[im 37/54]
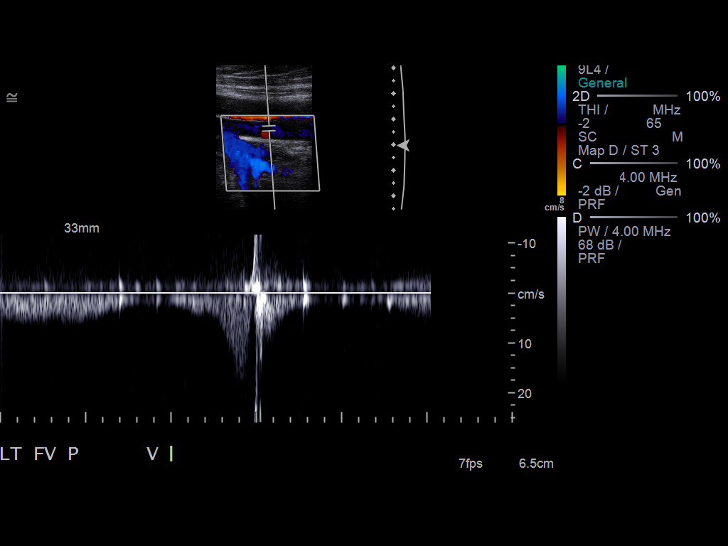
[im 42/54]
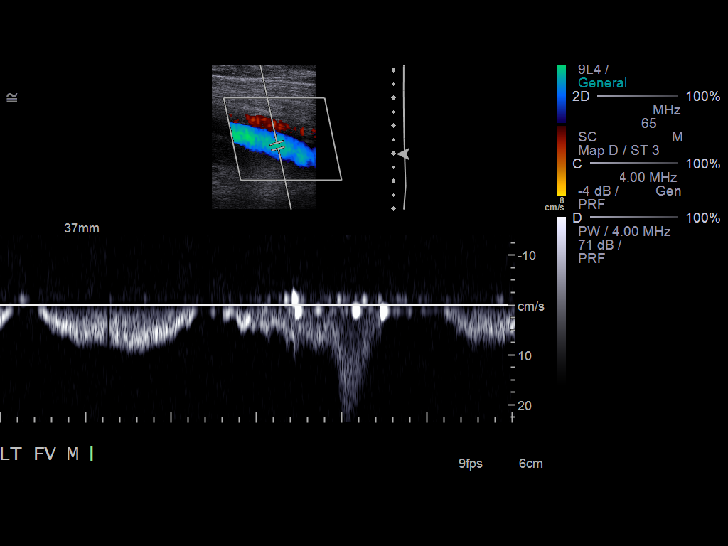
[im 44/54]
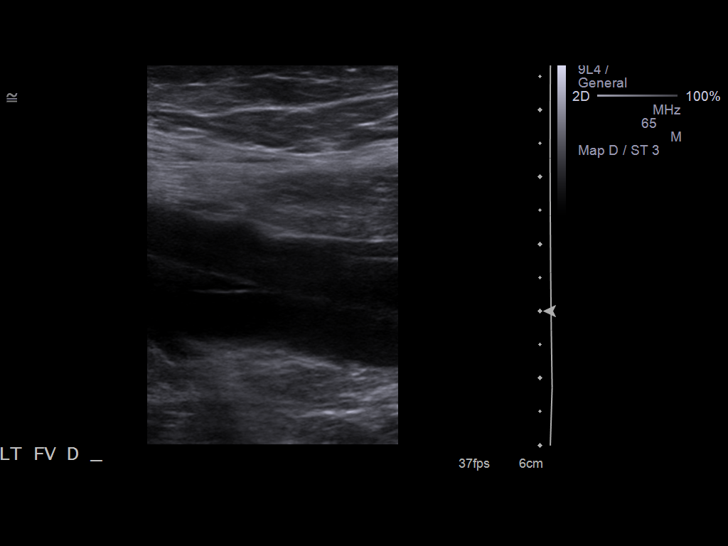
[im 49/54]
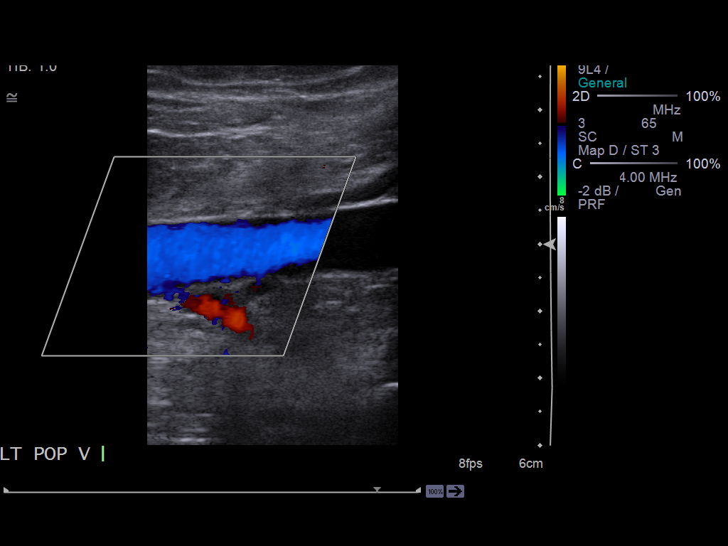
[im 54/54]
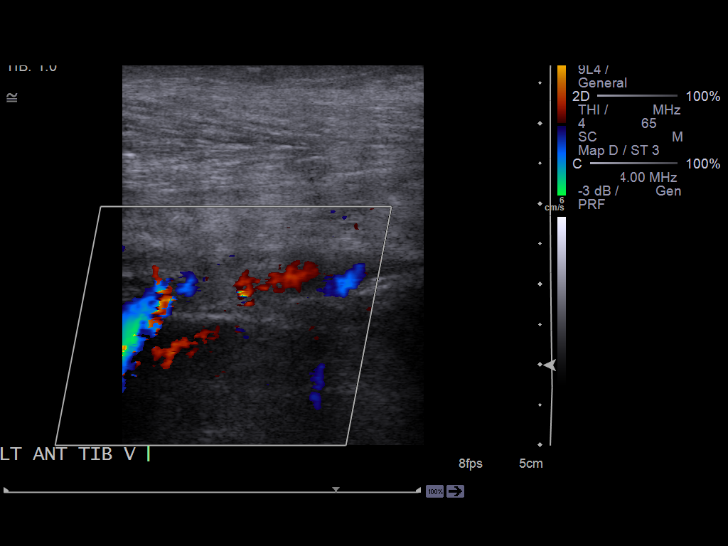

[14 of 24 positions shown; findings below may reference images not displayed]

FINDINGS: From the level of the common femoral vein to the
popliteal vein, there is adequate color flow, compression and
augmentation without evidence of a lower extremity deep venous
thrombosis on either side.  Visualized calf veins are patent.
IMPRESSION: No evidence of lower extremity deep venous thrombosis as noted
above.

## 2013-02-26 ENCOUNTER — Encounter (HOSPITAL_COMMUNITY): Payer: Self-pay | Admitting: *Deleted

## 2013-02-26 ENCOUNTER — Emergency Department (HOSPITAL_COMMUNITY)
Admission: EM | Admit: 2013-02-26 | Discharge: 2013-02-26 | Disposition: A | Discharge status: Home / Self Care | Payer: 59 | Source: Home / Self Care | Attending: Family Medicine | Admitting: Family Medicine

## 2013-02-26 DIAGNOSIS — H60399 Other infective otitis externa, unspecified ear: Secondary | ICD-10-CM

## 2013-02-26 DIAGNOSIS — H6092 Unspecified otitis externa, left ear: Secondary | ICD-10-CM

## 2013-02-26 MED ORDER — CIPROFLOXACIN-DEXAMETHASONE 0.3-0.1 % OT SUSP: 4.0000 [drp] | Freq: Two times a day (BID) | OTIC | Status: DC

## 2013-02-26 NOTE — ED Provider Notes (Signed)
CSN: [id number]     Arrival date & time 02/26/13  1349 History     First MD Initiated Contact with Patient 02/26/13 1453     Chief Complaint  Patient presents with  . Otalgia   (Consider location/radiation/quality/duration/timing/severity/associated sxs/prior Treatment) HPI  Ear ache: started on Thu. Had one episode the week prior that self resolved. L ear is painful. Worse chewing. Improves w/ rest. Tylenol w/some improvement. Denies lock jaw. Denies fevers, URI, sick, contacts, HA, syncope, dizziness, n/v/d/c. Uses Qtips regularly  History reviewed. No pertinent past medical history. Past Surgical History  Procedure Laterality Date  . Left knee 1989    . Knee surgery  left knee 1989  . Knee arthroscopy  1989    left    Family History  Problem Relation Age of Onset  . Cancer    . Anesthesia problems Neg Hx   . Hypotension Neg Hx   . Malignant hyperthermia Neg Hx   . Pseudochol deficiency Neg Hx    History  Substance Use Topics  . Smoking status: Never Smoker   . Smokeless tobacco: Not on file  . Alcohol Use: No    Review of Systems  Constitutional: Negative for fever, chills and activity change.  Neurological: Negative for light-headedness and headaches.  All other systems reviewed and are negative.    Allergies  Review of patient's allergies indicates no known allergies.  Home Medications   Current Outpatient Rx  Name  Route  Sig  Dispense  Refill  . bifidobacterium infantis (ALIGN) capsule   Oral   Take 1 capsule by mouth daily.         . fish oil-omega-3 fatty acids 1000 MG capsule   Oral   Take 2 g by mouth daily.         Marland Kitchen glucosamine-chondroitin 500-400 MG tablet   Oral   Take 1 tablet by mouth 3 (three) times daily.         Marland Kitchen HYDROcodone-acetaminophen (NORCO) 7.5-325 MG per tablet   Oral   Take 1 tablet by mouth every 6 (six) hours as needed. For pain         . ibuprofen (ADVIL,MOTRIN) 800 MG tablet   Oral   Take 800 mg by mouth  every 8 (eight) hours as needed.         . Multiple Vitamin (MULITIVITAMIN WITH MINERALS) TABS   Oral   Take 1 tablet by mouth daily.         . promethazine (PHENERGAN) 25 MG tablet   Oral   Take 25 mg by mouth every 6 (six) hours as needed.          BP 124/91  Pulse 66  Temp(Src) 97.6 F (36.4 C) (Oral)  Resp 16  SpO2 100% Physical Exam  Constitutional: He is oriented to person, place, and time. He appears well-developed. No distress.  HENT:  Head: Normocephalic.  L TM mildly injected. Ear canal injected adn rubbed smooth w/ skin breakdown in the 1 o'clock position near the TM w/ and w/ minimal purulent drainage. R TM nml and canal mildly injected  Musculoskeletal: Normal range of motion.  Neurological: He is oriented to person, place, and time.  Skin: Skin is warm and dry.  Psychiatric: He has a normal mood and affect. His behavior is normal. Judgment and thought content normal.    ED Course   Procedures (including critical care time)  Labs Reviewed - No data to display No results found. No diagnosis found.  MDM  42yo M w/ Otitis externa w/ purulent discharge and skin breakdown.  - Ciprodex otic - f/u PCP in 1 wk. - precautions given and red flags discussed  Shelly Flatten, MD Family Medicine PGY-3 02/26/2013, 3:30 PM    Ozella Rocks, MD 02/26/13 [phone]

## 2013-02-26 NOTE — ED Notes (Signed)
Pt  Reports    L  Earache  For  sev  DAYS   hE  REPORTS  SOME  PAIN L  JAW  AREA  AS  WELL  HE  REPORTS  HE  WNT TO  THE  DENTIST  EARLIER IN  WEEK  FOR  CLEANING  BUT  NO  DENTAL PROCEDURES  OTHERWISE  DONE

## 2013-02-28 NOTE — ED Provider Notes (Signed)
Medical screening examination/treatment/procedure(s) were performed by a resident physician or non-physician practitioner and as the supervising physician I was immediately available for consultation/collaboration.  Escarlet Saathoff, MD   Royelle Hinchman S Reeve Turnley, MD 02/28/13 0952 

## 2013-06-30 ENCOUNTER — Encounter: Payer: Self-pay | Admitting: Internal Medicine

## 2014-05-05 ENCOUNTER — Other Ambulatory Visit: Payer: Self-pay

## 2015-08-09 ENCOUNTER — Encounter (INDEPENDENT_AMBULATORY_CARE_PROVIDER_SITE_OTHER): Payer: Self-pay | Admitting: *Deleted

## 2015-10-12 ENCOUNTER — Other Ambulatory Visit (INDEPENDENT_AMBULATORY_CARE_PROVIDER_SITE_OTHER): Payer: Self-pay | Admitting: *Deleted

## 2015-10-12 DIAGNOSIS — Z86010 Personal history of colonic polyps: Secondary | ICD-10-CM

## 2015-10-12 DIAGNOSIS — Z800 Family history of malignant neoplasm of digestive organs: Secondary | ICD-10-CM

## 2015-10-29 ENCOUNTER — Other Ambulatory Visit (INDEPENDENT_AMBULATORY_CARE_PROVIDER_SITE_OTHER): Payer: Self-pay | Admitting: *Deleted

## 2015-10-29 ENCOUNTER — Encounter (INDEPENDENT_AMBULATORY_CARE_PROVIDER_SITE_OTHER): Payer: Self-pay | Admitting: *Deleted

## 2015-10-29 NOTE — Telephone Encounter (Signed)
Needs trilyte 

## 2015-10-30 DIAGNOSIS — R05 Cough: Secondary | ICD-10-CM | POA: Diagnosis not present

## 2015-10-30 DIAGNOSIS — E7800 Pure hypercholesterolemia, unspecified: Secondary | ICD-10-CM | POA: Diagnosis not present

## 2015-10-30 DIAGNOSIS — F458 Other somatoform disorders: Secondary | ICD-10-CM | POA: Diagnosis not present

## 2015-10-30 MED ORDER — PEG 3350-KCL-NA BICARB-NACL 420 G PO SOLR: 4000.0000 mL | Freq: Once | ORAL | Status: DC

## 2015-10-30 MED FILL — FLUTICASONE PROP 50 MCG SPR: 50 | 30 days supply | Qty: 16 | Fill #0

## 2015-11-15 ENCOUNTER — Telehealth (INDEPENDENT_AMBULATORY_CARE_PROVIDER_SITE_OTHER): Payer: Self-pay | Admitting: *Deleted

## 2015-11-15 NOTE — Telephone Encounter (Signed)
Referring MD/PCP: griffin   Procedure: tcs  Reason/Indication:  Hx polyps, fam hx colon ca  Has patient had this procedure before?  Yes, 2012  If so, when, by whom and where?    Is there a family history of colon cancer?  Yes, father  Who?  What age when diagnosed?    Is patient diabetic?   no      Does patient have prosthetic heart valve or mechanical valve?  no  Do you have a pacemaker?  no  Has patient ever had endocarditis? no  Has patient had joint replacement within last 12 months?  no  Does patient tend to be constipated or take laxatives? no  Does patient have a history of alcohol/drug use?  no  Is patient on Coumadin, Plavix and/or Aspirin? no  Medications: none  Allergies: nkda  Medication Adjustment:   Procedure date & time: 12/13/15 at 1200

## 2015-11-16 NOTE — Telephone Encounter (Signed)
agree

## 2015-11-20 DIAGNOSIS — H938X3 Other specified disorders of ear, bilateral: Secondary | ICD-10-CM | POA: Diagnosis not present

## 2015-11-20 DIAGNOSIS — H9202 Otalgia, left ear: Secondary | ICD-10-CM | POA: Diagnosis not present

## 2015-12-10 MED FILL — GAVILYTE-N SOLUTION: 420 | 1 days supply | Qty: 4000 | Fill #0

## 2015-12-11 DIAGNOSIS — H52223 Regular astigmatism, bilateral: Secondary | ICD-10-CM | POA: Diagnosis not present

## 2015-12-11 DIAGNOSIS — H524 Presbyopia: Secondary | ICD-10-CM | POA: Diagnosis not present

## 2015-12-11 DIAGNOSIS — H5201 Hypermetropia, right eye: Secondary | ICD-10-CM | POA: Diagnosis not present

## 2015-12-13 ENCOUNTER — Encounter (HOSPITAL_COMMUNITY)
Admission: RE | Disposition: A | Discharge status: Home / Self Care | Payer: Self-pay | Source: Ambulatory Visit | Attending: Internal Medicine

## 2015-12-13 ENCOUNTER — Ambulatory Visit (HOSPITAL_COMMUNITY)
Admission: RE | Admit: 2015-12-13 | Discharge: 2015-12-13 | Disposition: A | Discharge status: Home / Self Care | Payer: 59 | Source: Ambulatory Visit | Attending: Internal Medicine | Admitting: Internal Medicine

## 2015-12-13 ENCOUNTER — Encounter (HOSPITAL_COMMUNITY): Payer: Self-pay

## 2015-12-13 DIAGNOSIS — D120 Benign neoplasm of cecum: Secondary | ICD-10-CM | POA: Diagnosis not present

## 2015-12-13 DIAGNOSIS — Z1211 Encounter for screening for malignant neoplasm of colon: Secondary | ICD-10-CM | POA: Diagnosis not present

## 2015-12-13 DIAGNOSIS — Z800 Family history of malignant neoplasm of digestive organs: Secondary | ICD-10-CM | POA: Diagnosis not present

## 2015-12-13 DIAGNOSIS — Z86010 Personal history of colonic polyps: Secondary | ICD-10-CM

## 2015-12-13 HISTORY — PX: COLONOSCOPY: SHX5424

## 2015-12-13 HISTORY — PX: POLYPECTOMY: SHX5525

## 2015-12-13 SURGERY — COLONOSCOPY
Anesthesia: Moderate Sedation

## 2015-12-13 MED ORDER — SODIUM CHLORIDE 0.9 % IV SOLN
INTRAVENOUS | Status: DC
  Administered 2015-12-13: 11:00:00 via INTRAVENOUS

## 2015-12-13 MED ORDER — MEPERIDINE HCL 50 MG/ML IJ SOLN
INTRAMUSCULAR | Status: DC | PRN
  Administered 2015-12-13 (×2): 25 mg via INTRAVENOUS

## 2015-12-13 MED ORDER — MIDAZOLAM HCL 5 MG/5ML IJ SOLN
INTRAMUSCULAR | Status: DC | PRN
  Administered 2015-12-13: 1 mg via INTRAVENOUS
  Administered 2015-12-13 (×2): 2 mg via INTRAVENOUS

## 2015-12-13 MED ORDER — MIDAZOLAM HCL 5 MG/5ML IJ SOLN
INTRAMUSCULAR | Status: AC
  Filled 2015-12-13: qty 10

## 2015-12-13 MED ORDER — MEPERIDINE HCL 50 MG/ML IJ SOLN
INTRAMUSCULAR | Status: AC
  Filled 2015-12-13: qty 1

## 2015-12-13 MED ORDER — STERILE WATER FOR IRRIGATION IR SOLN
Status: DC | PRN
  Administered 2015-12-13: 2.5 mL

## 2015-12-13 NOTE — Op Note (Signed)
[facility] Patient Name: William Haley Procedure Date: 12/13/2015 11:17 AM MRN: [id number] Date of Birth: August 18, 1970 Attending MD: Hildred Laser , MD CSN: [id number] Age: 45 Admit Type: Outpatient Procedure:                Colonoscopy Indications:              High risk colon cancer surveillance: Personal                            history of colonic polyps, Family history of colon                            cancer in a first-degree relative Providers:                Hildred Laser, MD, Lurline Del, RN, Randa Spike,                            Technician Referring MD:             Lavone Orn, MD Medicines:                Meperidine 50 mg IV, Midazolam 6 mg IV Complications:            No immediate complications. Estimated Blood Loss:     Estimated blood loss was minimal. Procedure:                Pre-Anesthesia Assessment:                           - Prior to the procedure, a History and Physical                            was performed, and patient medications and                            allergies were reviewed. The patient's tolerance of                            previous anesthesia was also reviewed. The risks                            and benefits of the procedure and the sedation                            options and risks were discussed with the patient.                            All questions were answered, and informed consent                            was obtained. Prior Anticoagulants: The patient has                            taken no previous anticoagulant or antiplatelet  agents. ASA Grade Assessment: I - A normal, healthy                            patient. After reviewing the risks and benefits,                            the patient was deemed in satisfactory condition to                            undergo the procedure.                           After obtaining informed consent, the colonoscope   was passed under direct vision. Throughout the                            procedure, the patient's blood pressure, pulse, and                            oxygen saturations were monitored continuously. The                            [id number]) scope was introduced through                            the anus and advanced to the the cecum, identified                            by appendiceal orifice and ileocecal valve. The                            quality of the bowel preparation was excellent. The                            ileocecal valve, appendiceal orifice, and rectum                            were photographed. Scope In: 11:25:36 AM Scope Out: 11:49:10 AM Scope Withdrawal Time: 0 hours 19 minutes 42 seconds  Total Procedure Duration: 0 hours 23 minutes 34 seconds  Findings:      A 3 mm polyp was found in the cecum. The polyp was sessile. The polyp       was removed with a cold snare. Polyp resection was incomplete. The       resected tissue was retrieved. Fulguration to ablate the lesion remnants       by monopolar probe was successful.      The exam was otherwise normal throughout the examined colon.      The retroflexed view of the distal rectum and anal verge was normal and       showed no anal or rectal abnormalities. Impression:               - One 3 mm polyp in the cecum, removed with a cold  snare. Incomplete resection. Resected tissue                            retrieved. Treated with a monopolar probe. Moderate Sedation:      Moderate (conscious) sedation was administered by the endoscopy nurse       and supervised by the endoscopist. The following parameters were       monitored: oxygen saturation, heart rate, blood pressure, CO2       capnography and response to care. Total physician intraservice time was       29 minutes. Recommendation:           - Patient has a contact number available for                            emergencies.  The signs and symptoms of potential                            delayed complications were discussed with the                            patient. Return to normal activities tomorrow.                            Written discharge instructions were provided to the                            patient.                           - Resume previous diet today.                           - Continue present medications.                           - No aspirin, ibuprofen, naproxen, or other                            non-steroidal anti-inflammatory drugs for 10 days                            after polyp removal.                           - Await pathology results.                           - Repeat colonoscopy in 5 years for surveillance. Procedure Code(s):        --- Professional ---                           [phone], Colonoscopy, flexible; with removal of                            tumor(s), polyp(s), or other lesion(s) by snare  technique                           [phone], Moderate sedation services provided by the                            same physician or other qualified health care                            professional performing the diagnostic or                            therapeutic service that the sedation supports,                            requiring the presence of an independent trained                            observer to assist in the monitoring of the                            patient's level of consciousness and physiological                            status; initial 15 minutes of intraservice time,                            patient age 18 years or older                           [phone], Moderate sedation services; each additional                            15 minutes intraservice time Diagnosis Code(s):        --- Professional ---                           Z86.010, Personal history of colonic polyps                           D12.0, Benign neoplasm of  cecum                           Z80.0, Family history of malignant neoplasm of                            digestive organs CPT copyright 2016 American Medical Association. All rights reserved. The codes documented in this report are preliminary and upon coder review may  be revised to meet current compliance requirements. Hildred Laser, MD Hildred Laser, MD 12/13/2015 11:57:24 AM This report has been signed electronically. Number of Addenda: 0

## 2015-12-13 NOTE — Discharge Instructions (Signed)
No aspirin or NSAIDs for 10 days. Resume usual medications and diet. No driving for 24 hours. Physician will call with biopsy results.        Colonoscopy, Care After These instructions give you information on caring for yourself after your procedure. Your doctor may also give you more specific instructions. Call your doctor if you have any problems or questions after your procedure. HOME CARE  Do not drive for 24 hours.  Do not sign important papers or use machinery for 24 hours.  You may shower.  You may go back to your usual activities, but go slower for the first 24 hours.  Take rest breaks often during the first 24 hours.  Walk around or use warm packs on your belly (abdomen) if you have belly cramping or gas.  Drink enough fluids to keep your pee (urine) clear or pale yellow.  Resume your normal diet. Avoid heavy or fried foods.  Avoid drinking alcohol for 24 hours or as told by your doctor.  Only take medicines as told by your doctor. If a tissue sample (biopsy) was taken during the procedure:   Do not take aspirin or blood thinners for 7 days, or as told by your doctor.  Do not drink alcohol for 7 days, or as told by your doctor.  Eat soft foods for the first 24 hours. GET HELP IF: You still have a small amount of blood in your poop (stool) 2-3 days after the procedure. GET HELP RIGHT AWAY IF:  You have more than a small amount of blood in your poop.  You see clumps of tissue (blood clots) in your poop.  Your belly is puffy (swollen).  You feel sick to your stomach (nauseous) or throw up (vomit).  You have a fever.  You have belly pain that gets worse and medicine does not help. MAKE SURE YOU:  Understand these instructions.  Will watch your condition.  Will get help right away if you are not doing well or get worse.   This information is not intended to replace advice given to you by your health care provider. Make sure you discuss any questions  you have with your health care provider.   Document Released: 08/09/2010 Document Revised: 07/12/2013 Document Reviewed: 03/14/2013 Elsevier Interactive Patient Education 2016 Elsevier Inc.    Colon Polyps Polyps are lumps of extra tissue growing inside the body. Polyps can grow in the large intestine (colon). Most colon polyps are noncancerous (benign). However, some colon polyps can become cancerous over time. Polyps that are larger than a pea may be harmful. To be safe, caregivers remove and test all polyps. CAUSES  Polyps form when mutations in the genes cause your cells to grow and divide even though no more tissue is needed. RISK FACTORS There are a number of risk factors that can increase your chances of getting colon polyps. They include:  Being older than 50 years.  Family history of colon polyps or colon cancer.  Long-term colon diseases, such as colitis or Crohn disease.  Being overweight.  Smoking.  Being inactive.  Drinking too much alcohol. SYMPTOMS  Most small polyps do not cause symptoms. If symptoms are present, they may include:  Blood in the stool. The stool may look dark red or black.  Constipation or diarrhea that lasts longer than 1 week. DIAGNOSIS People often do not know they have polyps until their caregiver finds them during a regular checkup. Your caregiver can use 4 tests to check for polyps:  Digital rectal exam. The caregiver wears gloves and feels inside the rectum. This test would find polyps only in the rectum.  Barium enema. The caregiver puts a liquid called barium into your rectum before taking X-rays of your colon. Barium makes your colon look white. Polyps are dark, so they are easy to see in the X-ray pictures.  Sigmoidoscopy. A thin, flexible tube (sigmoidoscope) is placed into your rectum. The sigmoidoscope has a light and tiny camera in it. The caregiver uses the sigmoidoscope to look at the last third of your colon.  Colonoscopy.  This test is like sigmoidoscopy, but the caregiver looks at the entire colon. This is the most common method for finding and removing polyps. TREATMENT  Any polyps will be removed during a sigmoidoscopy or colonoscopy. The polyps are then tested for cancer. PREVENTION  To help lower your risk of getting more colon polyps:  Eat plenty of fruits and vegetables. Avoid eating fatty foods.  Do not smoke.  Avoid drinking alcohol.  Exercise every day.  Lose weight if recommended by your caregiver.  Eat plenty of calcium and folate. Foods that are rich in calcium include milk, cheese, and broccoli. Foods that are rich in folate include chickpeas, kidney beans, and spinach. HOME CARE INSTRUCTIONS Keep all follow-up appointments as directed by your caregiver. You may need periodic exams to check for polyps. SEEK MEDICAL CARE IF: You notice bleeding during a bowel movement.   This information is not intended to replace advice given to you by your health care provider. Make sure you discuss any questions you have with your health care provider.   Document Released: 04/02/2004 Document Revised: 07/28/2014 Document Reviewed: 09/16/2011 Elsevier Interactive Patient Education [employer].

## 2015-12-13 NOTE — H&P (Signed)
William Haley is an 45 y.o. male.   Chief Complaint: Patient is here for colonoscopy. HPI: Patient is 45 year old African-American male who is here for HIDA screening colonoscopy. He denies abdominal pain change in bowel habits or rectal bleeding. Last colonoscopy was in January 2012 with removal of single sessile serrated polyp. She also had few small hyperplastic polyps. History significant for CRC in father who is 81 at the time of diagnosis and died within few weeks of advanced disease.  History reviewed. No pertinent past medical history.  Past Surgical History  Procedure Laterality Date  . Left knee 1989    . Knee surgery  left knee 1989  . Knee arthroscopy  1989    left     Family History  Problem Relation Age of Onset  . Cancer    . Anesthesia problems Neg Hx   . Hypotension Neg Hx   . Malignant hyperthermia Neg Hx   . Pseudochol deficiency Neg Hx    Social History:  reports that he has never smoked. He does not have any smokeless tobacco history on file. He reports that he does not drink alcohol or use illicit drugs.  Allergies: No Known Allergies  Medications Prior to Admission  Medication Sig Dispense Refill  . fish oil-omega-3 fatty acids 1000 MG capsule Take 2 g by mouth daily.    . Multiple Vitamin (MULITIVITAMIN WITH MINERALS) TABS Take 1 tablet by mouth daily.    . polyethylene glycol-electrolytes (NULYTELY/GOLYTELY) 420 g solution Take 4,000 mLs by mouth once. 4000 mL 0    No results found for this or any previous visit (from the past 48 hour(s)). No results found.  ROS  Blood pressure 123/78, pulse 64, temperature 97.9 F (36.6 C), temperature source Oral, resp. rate 18, height 5\' 9"  (1.753 m), weight 204 lb (92.534 kg), SpO2 100 %. Physical Exam  Constitutional: He appears well-developed and well-nourished.  HENT:  Mouth/Throat: Oropharynx is clear and moist.  Eyes: Conjunctivae are normal. No scleral icterus.  Neck: No thyromegaly present.   Cardiovascular: Normal rate, regular rhythm and normal heart sounds.   No murmur heard. Respiratory: Effort normal and breath sounds normal.  GI: Soft. He exhibits no distension and no mass. There is no tenderness.  Musculoskeletal: He exhibits no edema.  Lymphadenopathy:    He has no cervical adenopathy.  Neurological: He is alert.  Skin: Skin is warm and dry.     Assessment/Plan History of colonic polyps. Family history of CRC in first-degree relative. Surveillance colonoscopy.  Hildred Laser, MD 12/13/2015, 11:14 AM

## 2015-12-18 ENCOUNTER — Encounter (HOSPITAL_COMMUNITY): Payer: Self-pay | Admitting: Internal Medicine

## 2015-12-19 DIAGNOSIS — Z800 Family history of malignant neoplasm of digestive organs: Secondary | ICD-10-CM | POA: Diagnosis not present

## 2015-12-19 DIAGNOSIS — D120 Benign neoplasm of cecum: Secondary | ICD-10-CM | POA: Diagnosis not present

## 2015-12-19 DIAGNOSIS — Z86010 Personal history of colonic polyps: Secondary | ICD-10-CM | POA: Diagnosis not present

## 2015-12-19 DIAGNOSIS — Z09 Encounter for follow-up examination after completed treatment for conditions other than malignant neoplasm: Secondary | ICD-10-CM | POA: Diagnosis not present

## 2015-12-26 DIAGNOSIS — H938X2 Other specified disorders of left ear: Secondary | ICD-10-CM | POA: Diagnosis not present

## 2016-10-03 DIAGNOSIS — R252 Cramp and spasm: Secondary | ICD-10-CM | POA: Diagnosis not present

## 2016-10-08 DIAGNOSIS — E7800 Pure hypercholesterolemia, unspecified: Secondary | ICD-10-CM | POA: Diagnosis not present

## 2016-10-08 DIAGNOSIS — Z0000 Encounter for general adult medical examination without abnormal findings: Secondary | ICD-10-CM | POA: Diagnosis not present

## 2016-10-08 DIAGNOSIS — Z23 Encounter for immunization: Secondary | ICD-10-CM | POA: Diagnosis not present

## 2016-10-08 DIAGNOSIS — Z125 Encounter for screening for malignant neoplasm of prostate: Secondary | ICD-10-CM | POA: Diagnosis not present

## 2016-10-08 DIAGNOSIS — R252 Cramp and spasm: Secondary | ICD-10-CM | POA: Diagnosis not present

## 2016-10-23 DIAGNOSIS — R748 Abnormal levels of other serum enzymes: Secondary | ICD-10-CM | POA: Diagnosis not present

## 2016-12-16 DIAGNOSIS — H52223 Regular astigmatism, bilateral: Secondary | ICD-10-CM | POA: Diagnosis not present

## 2016-12-16 DIAGNOSIS — H524 Presbyopia: Secondary | ICD-10-CM | POA: Diagnosis not present

## 2017-02-11 DIAGNOSIS — S63632A Sprain of interphalangeal joint of right middle finger, initial encounter: Secondary | ICD-10-CM | POA: Diagnosis not present

## 2017-02-11 MED FILL — MELOXICAM 15 MG TABLET: 15 | 14 days supply | Qty: 14 | Fill #0

## 2017-03-13 MED FILL — MELOXICAM 15 MG TABLET: 15 | 14 days supply | Qty: 14 | Fill #0

## 2017-05-16 DIAGNOSIS — Z202 Contact with and (suspected) exposure to infections with a predominantly sexual mode of transmission: Secondary | ICD-10-CM | POA: Diagnosis not present

## 2017-05-21 DIAGNOSIS — Z113 Encounter for screening for infections with a predominantly sexual mode of transmission: Secondary | ICD-10-CM | POA: Diagnosis not present

## 2017-05-21 DIAGNOSIS — A5400 Gonococcal infection of lower genitourinary tract, unspecified: Secondary | ICD-10-CM | POA: Diagnosis not present

## 2017-05-21 DIAGNOSIS — Z0000 Encounter for general adult medical examination without abnormal findings: Secondary | ICD-10-CM | POA: Diagnosis not present

## 2017-09-11 ENCOUNTER — Other Ambulatory Visit: Payer: Self-pay | Admitting: Internal Medicine

## 2017-09-11 ENCOUNTER — Ambulatory Visit
Admission: RE | Admit: 2017-09-11 | Discharge: 2017-09-11 | Disposition: A | Discharge status: Home / Self Care | Payer: 59 | Source: Ambulatory Visit | Attending: Internal Medicine | Admitting: Internal Medicine

## 2017-09-11 DIAGNOSIS — M79644 Pain in right finger(s): Secondary | ICD-10-CM

## 2017-10-02 MED FILL — MELOXICAM 15 MG TABLET: 15 | 14 days supply | Qty: 14 | Fill #0

## 2017-10-27 MED FILL — LUMIGAN 0.01% EYE DROPS: 0.01 | 30 days supply | Qty: 3 | Fill #0

## 2017-12-08 MED FILL — LUMIGAN 0.01% EYE DROPS: 0.01 | 30 days supply | Qty: 3 | Fill #0

## 2018-02-24 MED FILL — LUMIGAN 0.01% EYE DROPS: 0.01 | 50 days supply | Qty: 5 | Fill #0

## 2018-05-10 ENCOUNTER — Ambulatory Visit: Payer: No Typology Code available for payment source | Attending: Internal Medicine

## 2018-05-10 DIAGNOSIS — M545 Low back pain, unspecified: Secondary | ICD-10-CM

## 2018-05-10 NOTE — Patient Instructions (Signed)
Access Code: [email or web address]  URL: [email or web address]  Date: 05/10/2018  Prepared by: Roxana Hires   Exercises  Supine Pelvic Tilt - 10 reps - 2 sets - 3 seconds hold - 1x daily - 7x weekly  Supine Lumbar Rotation Stretch - 3 sets - 30s hold x 3 each direction hold - 1x daily - 7x weekly  Thoracic Mobilization on Foam Roll - 3-5 minutes hold - 1x daily - 7x weekly  Thoracic Mobilization on Foam Roll - Hands Clasped - 5 reps - 1 sets - 3s hold, 5 reps at each level hold - 1x daily - 7x weekly

## 2018-05-10 NOTE — Therapy (Signed)
[place] MAIN [facility] SERVICES [address] [place], [place], [address] Phone: [phone]   Fax:  [phone]  Physical Therapy Screen  Patient Details  Name: William Haley MRN: [id number] Date of Birth: Jun 23, 1971 No data recorded  Encounter Date: 05/10/2018    History reviewed. No pertinent past medical history.  Past Surgical History:  Procedure Laterality Date  . COLONOSCOPY N/A 12/13/2015   Procedure: COLONOSCOPY;  Surgeon: Rogene Houston, MD;  [facility];  Service: Endoscopy;  Laterality: N/A;  1200  . KNEE ARTHROSCOPY  1989   left   . KNEE SURGERY  left knee 1989  . left knee 1989    . POLYPECTOMY  12/13/2015   Procedure: POLYPECTOMY;  Surgeon: Rogene Houston, MD;  [facility];  Service: Endoscopy;;  Polyp at cecum    There were no vitals filed for this visit.     PT Screening Form   Time: in_1350_____     Time out_1430____   Complaint: Pt reports upper lumbar/lower thoracic midline back pain. No reported radiation of pain out to the sides or down the back. Denies numbness/tingling. Pain started the day after Labor Day. Pt reports that he went bowling that weekend and then played golf later that week. Symptoms were progressive and no known trauma. No prior history of back issues. No imaging and no visit with PCP. Pt describes pain as intermittent pressure during activity. Pain only comes on with particular movements. Aggravating factors: bending down toward the floor, push-ups, bagging grass after mowing, laying on stomach, standing shoulder presses, and standing tricep presses behind head; Easing factors: sitting, no improvement with Advil/Tylenol, has not tried ice/heat, no help with massage. Worst: 5/10, Best: 0/10; Present: 0/10. Symptoms have worsened slightly since onset. ROS negative for red flags including fevers, chills, nausea, vomiting, night sweats, personal history of cancer, weight  gain/loss. Pt denies any other recent changes in his health. Patient's phone number: [phone] MD: [facility] [place] Status Patient is oriented to person, place and time.  Recent memory is intact.  Remote memory is intact.  Attention span and concentration are intact.  Expressive speech is intact.  Patient's fund of knowledge is within normal limits for educational level.  SENSATION: Grossly intact to light touch bilateral L as determined by testing dermatomes L2-S2 Proprioception and hot/cold testing deferred on this date   MUSCULOSKELETAL: Tremor: None Bulk: Normal Tone: Normal  Posture No gross deficits noted in sitting posture  Palpation Mild pain with palpation along L lumbar paraspinals but otherwise no pain with superficial palpation;   Strength (out of 5) R/L 5/5 Hip flexion 5/5 Hip ER 5/5 Hip IR 4-/4- Hip abduction 4-/4- Hip adduction 4-/4- Hip extension 5/5 Knee extension 5/5 Knee flexion 5/5 Ankle dorsiflexion *Indicates pain   AROM Lumbar flexion grossly WFL but painful at end range and with overpressure. Decreased segmental lumbar mobility during extension and pain at end range and with overpressure. Lateral flexion and rotation are both [facility] and painless with overpressure. Mild reproduction of back pain with R hip IR/ER. Mild limitation in L hip ER compared to R hip ER.   Repeated Movements No centralization or peripheralization of symptoms with lumbar flexion or extension   Muscle Length Hamstrings: grossly 90 degrees bilateral and painless Ober: R: Not examined L: Not examined   Passive Accessory Intervertebral Motion (PAIVM) Pt reports positive reproduction of concordant pain with CPA  L1-L3, no pain with UPA bilaterally in lumbar spine. Pain with CPA in mid thoracic region as well from T3-T7 but not a reproduction of patient's pain. Generally hypomobile throughout thoracic and lumbar  spine.   SPECIAL TESTS Lumbar quadrant: R: Not examined L: Not examined Slump: R: Negative L: Negative SLR: R: Negative L:  Negative Crossed SLR: R: Negative L: Negative FABER: R: Negative L: Negative FADIR: R: Negative L: Negative  Hip scour: R: Negative L: Negative Ely: R: Not examined L: Not examined Marcello Moores: R: Not examined L: Not examined Ober: R: Not examined L: Not examined Forward Step-Down Test: R: Not examined L: Not examined Lateral Step-Down Test: R: Not examined L: Not examined Deep Squat: Not examined            Assessment: Pt is a pleasant 47 year-old male who came to PT for a screen related to his back pain. PT examination reveals positive reproduction of his pain with central posterior to anterior spring testing of first through third lumbar vertebrae. His lumbar musculature is mostly pain-free to palpation with the exception of some mild pain in L lumbar paraspinals. Pain reproduced with forward active flexion and extension and worsened with overpressure. Patient is lacking segmental lumbar extension and activities in extension tend to be the most painful. Pt would benefit from skilled PT services to address deficits in his lumbar mobility. Pt encouraged to schedule with his PCP to obtain an order for therapy. Pt would benefit from skilled PT services to address deficits in his back pain and return to pain-free function at home and work.      Recommendations: Pt encouraged to follow-up with PCP and obtain an order for physical therapy.   Comments: Pt provided with pelvic tilts, lumbar rotation stretch, foam rolling, and repeated thoracic extension over foam roller for a introductory HEP.     [x]  Patient would benefit from an MD referral [x]  Patient would benefit from a full PT/OT/ SLP evaluation and treatment. []  No intervention recommended at this time.               Objective measurements completed on examination: See above findings.                              Patient will benefit from skilled therapeutic intervention in order to improve the following deficits and impairments:     Visit Diagnosis: Acute midline low back pain without sciatica     Problem List Patient Active Problem List   Diagnosis Date Noted  . Effusion of knee joint, left 03/02/2012  . S/P arthroscopy of left knee 07/31/2011  . Abnormality of gait 07/30/2011  . Restriction of joint motion 07/30/2011  . Muscle weakness (generalized) 07/30/2011  . 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 S/P ACL surgery 03/19/2011  . S/P left knee arthroscopy 03/19/2011   Phillips Grout PT, DPT, GCS  Jaleiah Asay 05/10/2018, 3:59 PM  [place] MAIN [facility] SERVICES [address] [place], [place], [address] Phone: [phone]   Fax:  [phone]  Name: William Haley MRN: [id number] Date of Birth: 1971/03/17

## 2018-05-31 ENCOUNTER — Other Ambulatory Visit: Payer: Self-pay | Admitting: Internal Medicine

## 2018-05-31 ENCOUNTER — Ambulatory Visit
Admission: RE | Admit: 2018-05-31 | Discharge: 2018-05-31 | Disposition: A | Discharge status: Home / Self Care | Payer: No Typology Code available for payment source | Source: Ambulatory Visit | Attending: Internal Medicine | Admitting: Internal Medicine

## 2018-05-31 DIAGNOSIS — M545 Low back pain, unspecified: Secondary | ICD-10-CM

## 2018-06-21 ENCOUNTER — Ambulatory Visit: Payer: No Typology Code available for payment source | Attending: Internal Medicine

## 2018-06-21 DIAGNOSIS — M6281 Muscle weakness (generalized): Secondary | ICD-10-CM | POA: Insufficient documentation

## 2018-06-21 DIAGNOSIS — M545 Low back pain, unspecified: Secondary | ICD-10-CM

## 2018-06-21 NOTE — Therapy (Signed)
[place] MAIN [facility] SERVICES [address] [place], [place], [address] Phone: [phone]   Fax:  [phone]  Physical Therapy Evaluation  Patient Details  Name: William Haley MRN: [id number] Date of Birth: [birth year] Referring Provider (PT): Dr. Gaynelle Arabian   Encounter Date: 06/21/2018  PT End of Session - 06/21/18 0907    Visit Number  1    Number of Visits  13    Date for PT Re-Evaluation  08/02/18    Authorization Type  Progress Note: 1/10, Last goals: 06/21/18 (eval)    PT Start Time  0807    PT Stop Time  0900    PT Time Calculation (min)  53 min    Activity Tolerance  Patient tolerated treatment well    Behavior During Therapy  [facility] for tasks assessed/performed       History reviewed. No pertinent past medical history.  Past Surgical History:  Procedure Laterality Date  . COLONOSCOPY N/A 12/13/2015   Procedure: COLONOSCOPY;  Surgeon: Rogene Houston, MD;  [facility];  Service: Endoscopy;  Laterality: N/A;  1200  . KNEE ARTHROSCOPY  1989   left   . KNEE SURGERY  left knee 1989  . left knee 1989    . POLYPECTOMY  12/13/2015   Procedure: POLYPECTOMY;  Surgeon: Rogene Houston, MD;  [facility];  Service: Endoscopy;;  Polyp at cecum    There were no vitals filed for this visit.   Subjective Assessment - 06/21/18 0834    Subjective  Low back pain    Pertinent History  Pt reports upper lumbar/lower thoracic midline back pain. No reported radiation of pain out to the sides or down the back. Denies numbness/tingling. Pain started the day after Labor Day 2019. Pt reports that he went bowling that weekend and then played golf later that week. Symptoms were progressive and no known trauma. No prior history of back issues. Saw his PCP after the PT screen who ordered plain film radiographs. Mild degenerative changes but nothing specific notes. Pt describes pain as intermittent pressure during activity. Pain only  comes on with particular movements. Aggravating factors: bending down toward the floor, push-ups, bagging grass after mowing, laying on stomach, standing shoulder presses, and standing tricep presses behind head; Easing factors: sitting, no improvement with Advil/Tylenol, no help with massage, has not tried ice or heat. Used icy hot patch without improvement. Worst: 5/10, Best: 0/10; Present: 0/10. Initially symptoms worsened slightly since onset but since the initial PT screening. She has been working out on his core and LE strength with a trainer which has not increased his pain. ROS negative for red flags including fevers, chills, nausea, vomiting, night sweats, personal history of cancer, weight gain/loss. Pt denies any other recent changes in his health. [facility] [place] is PCP.     How long can you sit comfortably?  No limitation    How long can you stand comfortably?  No limitation    How long can you walk comfortably?  No limitation    Diagnostic tests  plain film radiographs of thoracic and lumbar spine no acute, mild degenerative changes.     Currently in Pain?  No/denies    Pain Score  0-No pain   Worst: 5/10, Best; 0/10   Pain Location  Back    Pain Orientation  Lower    Pain Descriptors / Indicators  Pressure    Pain Type  Chronic pain  Pain Radiating Towards  None    Pain Onset  More than a month ago    Pain Frequency  Intermittent    Aggravating Factors   bending down toward the floor, push-ups, bagging grass after mowing, laying on stomach, standing shoulder presses, and standing tricep presses behind head    Pain Relieving Factors   sitting, no improvement with Advil/Tylenol, no help with massage, has not tried ice or heat. Used icy hot patch without improvement    Multiple Pain Sites  No          SUBJECTIVE Chief complaint: Complaint: Pt reports upper lumbar/lower thoracic midline back pain. No reported radiation of pain out to the sides  or down the back. Denies numbness/tingling. Pain started the day after Labor Day 2019. Pt reports that he went bowling that weekend and then played golf later that week. Symptoms were progressive and no known trauma. No prior history of back issues. Saw his PCP after the PT screen who ordered plain film radiographs. Mild degenerative changes but nothing specific notes. Pt describes pain as intermittent pressure during activity. Pain only comes on with particular movements. Aggravating factors: bending down toward the floor, push-ups, bagging grass after mowing, laying on stomach, standing shoulder presses, and standing tricep presses behind head; Easing factors: sitting, no improvement with Advil/Tylenol, no help with massage, has not tried ice or heat. Used icy hot patch without improvement. Worst: 5/10, Best: 0/10; Present: 0/10. Initially symptoms worsened slightly since onset but since the initial PT screening. She has been working out on his core and LE strength with a trainer which has not increased his pain. ROS negative for red flags including fevers, chills, nausea, vomiting, night sweats, personal history of cancer, weight gain/loss. Pt denies any other recent changes in his health. [facility] [place] is PCP.  Onset: After Labor Day 2019 Referring Dx: Low back pain MD: Dr. Gaynelle Arabian Pain: 0/10 Present, 0/10 Best, 5/10 Worst: 24 hour pain behavior: Stiff in the AM and improves as day goes How long can you sit: No limitation How long can you stand: A couple hours How long can you walk: No limitation Recent back trauma: No Prior history of back injury or pain: No Pain quality: pain quality: pressure Radiating pain: No  Numbness/Tingling: No Follow-up appointment with MD: No Dominant hand: right Imaging: Yes, plain film radiographs, no acute, mild degenerative changes.      OBJECTIVE  Mental Status Patient is oriented to person, place and time.  Recent  memory is intact.  Remote memory is intact.  Attention span and concentration are intact.  Expressive speech is intact.  Patient's fund of knowledge is within normal limits for educational level.  SENSATION: Grossly intact to light touch bilateralLas determined by testing dermatomes L2-S2 Proprioception and hot/cold testing deferred on this date  MUSCULOSKELETAL: Tremor: None Bulk: Normal Tone: Normal  Posture No gross deficits noted in sitting posture with the exception of som forward head.  Palpation Mild pain with palpation along L lumbar paraspinals but otherwise no pain with superficial palpation;  Strength (out of 5) R/L 5/5Hip flexion 5/5 Hip ER 5/5Hip IR 4-/4- Hip abduction 4-/4- Hip adduction 4-/4- Hip extension 5/5 Knee extension 5/5 Knee flexion 5/5Ankle dorsiflexion *Indicates pain  AROM Lumbar flexion grossly WFL but painful at end range and with overpressure. Decreased segmental lumbar mobility during extension and pain at end range and with overpressure. Lateral flexion and rotation are both [employer] and painless with overpressure. Mild  reproduction of back pain with R hip IR/ER. L IR: 40 ER: 30, R IR: 42, ER: 42. Tightness reported in back with L hip IR;   Repeated Movements No centralization or peripheralization of symptoms with lumbar flexion, mild improvement in pain with lumbar extension  Muscle Length Hamstrings: grossly 90 degrees bilateral and painless Ober: R: Negative L: Negative  Passive Accessory Intervertebral Motion (PAIVM) Pt reports positive reproduction of concordant pain with CPA and bilateral UPA L1-L3. No pain with CPA spring testing in thoracic spine. Generally hypomobilethroughout thoracic and lumbar spine.  SPECIAL TESTS Lumbar quadrant: R: Negative L: Negative Slump: R: Negative L: Negative SLR: R: Negative L:  Negative Crossed SLR: R: Negative L: Negative FABER: R: Negative L: Negative FADIR: R: Negative L:  Negative          Hip scour: R: Negative L: Negative Ely: R: Not examined L: Not examined Marcello Moores: R: Not examined L: Not examined Ober: R: Not examined L: Not examined Forward Step-Down Test: R: Not examined L: Not examined Lateral Step-Down Test: R: Not examined L: Not examined Deep Squat: Not examined                           Objective measurements completed on examination: See above findings.     TREATMENT   Manual Therapy  Prone CPA L1-L3, grade III, 30s/bout x 3 bouts/level;  Prone repeated extension 2 x 10 with overpressure by therapist at varying levels of lumbar spine, oscillation x 10 performed at end range during last rep of each set; HEP issued with education about how to perform properly at home.          PT Education - 06/21/18 0906    Education Details  HEP, plan of care    Person(s) Educated  Patient    Methods  Explanation;Demonstration;Verbal cues;Handout;Tactile cues    Comprehension  Verbalized understanding       PT Short Term Goals - 06/22/18 1222      PT SHORT TERM GOAL #1   Title  Pt will be independent with HEP in order to improve strength and decrease back pain in order to improve pain-free function at home and work.     Time  3    Period  Weeks    Status  New    Target Date  07/12/18        PT Long Term Goals - 06/22/18 1224      PT LONG TERM GOAL #1   Title  Pt will decrease mODI score to 0% in order demonstrate clinically significant reduction in back pain/disability.     Baseline  06/21/18: 8%    Time  8    Period  Weeks    Status  New    Target Date  08/02/18      PT LONG TERM GOAL #2   Title  Pt will decrease worst back pain as reported on NPRS by at least 2 points in order to demonstrate clinically significant reduction in back pain.     Baseline  06/21/18: 5/10    Time  6    Period  Weeks    Status  New    Target Date  08/02/18      PT LONG TERM GOAL #3   Title  Pt will increase hip extension,  abduction, and adduction strength of by at least 1/2 MMT grade in order to demonstrate improvement in strength and function.  Baseline  06/21/18: 4-/5 bilaterally for extension, abduction, and adduction    Time  6    Period  Weeks    Status  New    Target Date  08/02/17             Plan - 06/22/18 1220    Clinical Impression Statement  Pt is a pleasant 47 year-old male who was referred to PT for low back pain. PT examination reveals positive reproduction of his pain with central and unilateral posterior to anterior spring testing of first through third lumbar vertebrae. His lumbar musculature is pain-free to palpation. Pain reproduced with forward active flexion and extension and worsened with overpressure. Patient is lacking segmental lumbar extension and activities in extension tend to be the most painful. Pain abolished today with repeated extension with overpressure. Mild reproduction of pain with R hip internal and external rotation. Weakness noted with bilateral hip extension, abduction, and adduction. Pt would benefit from skilled PT services to address deficits in his back pain and return to pain-free function at home and work.     History and Personal Factors relevant to plan of care:  1 personal factors/comorbidities, 1 body systems/activity limitations/participation restrictions    Clinical Presentation  Stable    Clinical Decision Making  Low    Rehab Potential  Excellent    PT Frequency  2x / week    PT Duration  6 weeks    PT Treatment/Interventions  ADLs/Self Care Home Management;Aquatic Therapy;Biofeedback;Canalith Repostioning;Cryotherapy;Electrical Stimulation;Iontophoresis 4mg /ml Dexamethasone;Moist Heat;Traction;Ultrasound;Gait training;Therapeutic activities;Therapeutic exercise;Balance training;Neuromuscular re-education;Patient/family education;Manual techniques;Dry needling;Passive range of motion;Vestibular;Spinal Manipulations;Joint Manipulations    PT Next Visit  Plan  Assess response to extension-based program, if responding well continue press-ups with OP and spinal mobs    PT Home Exercise Plan  Prone press ups (at home), standing repeated extension (at work)    Oncologist with Plan of Care  Patient       Patient will benefit from skilled therapeutic intervention in order to improve the following deficits and impairments:  Decreased strength, Decreased range of motion, Pain  Visit Diagnosis: Acute midline low back pain without sciatica - Plan: PT plan of care cert/re-cert  Muscle weakness (generalized) - Plan: PT plan of care cert/re-cert     Problem List Patient Active Problem List   Diagnosis Date Noted  . Effusion of knee joint, left 03/02/2012  . S/P arthroscopy of left knee 07/31/2011  . Abnormality of gait 07/30/2011  . Restriction of joint motion 07/30/2011  . Muscle weakness (generalized) 07/30/2011  . 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 S/P ACL surgery 03/19/2011  . S/P left knee arthroscopy 03/19/2011   Phillips Grout PT, DPT, GCS  Aveena Bari 06/22/2018, 12:31 PM  [place] MAIN [facility] SERVICES [address] [place], [place], [address] Phone: [phone]   Fax:  [phone]  Name: William Haley MRN: [id number] Date of Birth: 08-09-1970

## 2018-06-21 NOTE — Patient Instructions (Signed)
Access Code: [email or web address]  URL: [email or web address]  Date: 06/21/2018  Prepared by: Roxana Hires   Exercises  Prone Press Up - 10 reps - 2 sets - 3 seconds hold - 2x daily - 7x weekly  Standing Lumbar Extension - 10 reps - 2 sets - 3 seconds hold - 4x daily - 7x weekly

## 2018-06-24 ENCOUNTER — Ambulatory Visit: Payer: No Typology Code available for payment source

## 2018-06-28 ENCOUNTER — Ambulatory Visit: Payer: No Typology Code available for payment source | Admitting: Physical Therapy

## 2018-06-28 ENCOUNTER — Encounter: Payer: Self-pay | Admitting: Physical Therapy

## 2018-06-28 DIAGNOSIS — M545 Low back pain, unspecified: Secondary | ICD-10-CM

## 2018-06-28 DIAGNOSIS — M6281 Muscle weakness (generalized): Secondary | ICD-10-CM

## 2018-06-28 NOTE — Therapy (Signed)
[place] [facility] SERVICES [address] [place], [place], [address] Phone: [phone]   Fax:  [phone]  Physical Therapy Treatment  Patient Details  Name: William Haley MRN: [id number] Date of Birth: [birth year] Referring Provider (PT): Dr. Gaynelle Arabian   Encounter Date: 06/28/2018  PT End of Session - 06/28/18 0854    Visit Number  2    Number of Visits  13    Date for PT Re-Evaluation  08/02/18    Authorization Type  Progress Note: 2/10, Last goals: 06/21/18 (eval)    PT Start Time  0845    PT Stop Time  0930    PT Time Calculation (min)  45 min    Activity Tolerance  Patient tolerated treatment well    Behavior During Therapy  [facility] for tasks assessed/performed       History reviewed. No pertinent past medical history.  Past Surgical History:  Procedure Laterality Date  . COLONOSCOPY N/A 12/13/2015   Procedure: COLONOSCOPY;  Surgeon: Rogene Houston, MD;  [facility];  Service: Endoscopy;  Laterality: N/A;  1200  . KNEE ARTHROSCOPY  1989   left   . KNEE SURGERY  left knee 1989  . left knee 1989    . POLYPECTOMY  12/13/2015   Procedure: POLYPECTOMY;  Surgeon: Rogene Houston, MD;  [facility];  Service: Endoscopy;;  Polyp at cecum    There were no vitals filed for this visit.  Subjective Assessment - 06/28/18 0853    Subjective  Patient reports that he is having 2/10 pain and he feels better now.     Pertinent History  Pt reports upper lumbar/lower thoracic midline back pain. No reported radiation of pain out to the sides or down the back. Denies numbness/tingling. Pain started the day after Labor Day 2019. Pt reports that he went bowling that weekend and then played golf later that week. Symptoms were progressive and no known trauma. No prior history of back issues. Saw his PCP after the PT screen who ordered plain film radiographs. Mild degenerative changes but nothing specific notes. Pt describes pain  as intermittent pressure during activity. Pain only comes on with particular movements. Aggravating factors: bending down toward the floor, push-ups, bagging grass after mowing, laying on stomach, standing shoulder presses, and standing tricep presses behind head; Easing factors: sitting, no improvement with Advil/Tylenol, no help with massage, has not tried ice or heat. Used icy hot patch without improvement. Worst: 5/10, Best: 0/10; Present: 0/10. Initially symptoms worsened slightly since onset but since the initial PT screening. She has been working out on his core and LE strength with a trainer which has not increased his pain. ROS negative for red flags including fevers, chills, nausea, vomiting, night sweats, personal history of cancer, weight gain/loss. Pt denies any other recent changes in his health. [facility] is PCP.     How long can you sit comfortably?  No limitation    How long can you stand comfortably?  No limitation    How long can you walk comfortably?  No limitation    Diagnostic tests  plain film radiographs of thoracic and lumbar spine no acute, mild degenerative changes.     Currently in Pain?  Yes    Pain Score  2     Pain Location  Back    Pain Orientation  Lower    Pain Descriptors / Indicators  Aching  Pain Type  Chronic pain    Pain Radiating Towards  NA    Pain Onset  More than a month ago    Aggravating Factors   shoulder press,    Effect of Pain on Daily Activities  difficulty walking       Treatment: Nu-step for warm up 3 mins Manual therapy: PA glides grade 3 and 4  X 30 bouts T4- L 5 Therapeutic exercise: Prone lumbar extension x 10  Faber test negative bilaterally Prone knee flex test negative bilaterally Negative spring test T 4- L 5  Instructed in seated body positioning  Hooklying marching with TA x 1 min x 2 Hooklying hip abd/ER with TA x 1 min and GTB  Instructed in TA exercise Patient has no pain  following treatment.                            PT Short Term Goals - 06/22/18 1222      PT SHORT TERM GOAL #1   Title  Pt will be independent with HEP in order to improve strength and decrease back pain in order to improve pain-free function at home and work.     Time  3    Period  Weeks    Status  New    Target Date  07/12/18        PT Long Term Goals - 06/22/18 1224      PT LONG TERM GOAL #1   Title  Pt will decrease mODI score to 0% in order demonstrate clinically significant reduction in back pain/disability.     Baseline  06/21/18: 8%    Time  8    Period  Weeks    Status  New    Target Date  08/02/18      PT LONG TERM GOAL #2   Title  Pt will decrease worst back pain as reported on NPRS by at least 2 points in order to demonstrate clinically significant reduction in back pain.     Baseline  06/21/18: 5/10    Time  6    Period  Weeks    Status  New    Target Date  08/02/18      PT LONG TERM GOAL #3   Title  Pt will increase hip extension, abduction, and adduction strength of by at least 1/2 MMT grade in order to demonstrate improvement in strength and function.    Baseline  06/21/18: 4-/5 bilaterally for extension, abduction, and adduction    Time  6    Period  Weeks    Status  New    Target Date  08/02/17            Plan - 06/28/18 0855    Clinical Impression Statement  Patient is responding to lumbar extension exercises.  Patient has 2/10 pain today prior to PT visit. He completes Prone extension exercises, manual therapy for PA glides grade 3 and 4 to T 4-L 5  wiithout reports of pain. He is progressed with core strengthening and seated positioning instructed.Patient reports no pain following PT.  Patient will conintue to benefit from skilled PT to reach goals.     Rehab Potential  Excellent    PT Frequency  2x / week    PT Duration  6 weeks    PT Treatment/Interventions  ADLs/Self Care Home Management;Aquatic  Therapy;Biofeedback;Canalith Repostioning;Cryotherapy;Electrical Stimulation;Iontophoresis 4mg /ml Dexamethasone;Moist Heat;Traction;Ultrasound;Gait training;Therapeutic activities;Therapeutic exercise;Balance training;Neuromuscular re-education;Patient/family education;Manual techniques;Dry needling;Passive  range of motion;Vestibular;Spinal Manipulations;Joint Manipulations    PT Next Visit Plan  Assess response to extension-based program, if responding well continue press-ups with OP and spinal mobs    PT Home Exercise Plan  Prone press ups (at home), standing repeated extension (at work)    Oncologist with Plan of Care  Patient       Patient will benefit from skilled therapeutic intervention in order to improve the following deficits and impairments:  Decreased strength, Decreased range of motion, Pain  Visit Diagnosis: Acute midline low back pain without sciatica  Muscle weakness (generalized)     Problem List Patient Active Problem List   Diagnosis Date Noted  . Effusion of knee joint, left 03/02/2012  . S/P arthroscopy of left knee 07/31/2011  . Abnormality of gait 07/30/2011  . Restriction of joint motion 07/30/2011  . Muscle weakness (generalized) 07/30/2011  . 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 S/P ACL surgery 03/19/2011  . S/P left knee arthroscopy 03/19/2011    Alanson Puls, PT DPT 06/28/2018, 9:36 AM  [place] [facility] SERVICES [address] [place], [place], [address] Phone: [phone]   Fax:  [phone]  Name: William Haley MRN: [id number] Date of Birth: 01/24/1971

## 2018-07-01 ENCOUNTER — Encounter: Payer: No Typology Code available for payment source | Admitting: Physical Therapy

## 2018-07-05 ENCOUNTER — Ambulatory Visit: Payer: No Typology Code available for payment source

## 2018-09-01 MED FILL — FLUoxetine HCL 20 MG CAPS: 20 | 90 days supply | Qty: 90 | Fill #0

## 2018-09-01 MED FILL — FLUoxetine HCL 10 MG CAPS: 10 | 5 days supply | Qty: 5 | Fill #0

## 2018-09-22 MED FILL — LUMIGAN 0.01% EYE DROPS: 0.01 | 75 days supply | Qty: 8 | Fill #0

## 2018-12-08 MED FILL — FLUoxetine HCL 20 MG CAPS: 20 | 90 days supply | Qty: 90 | Fill #0

## 2019-03-10 DIAGNOSIS — F659 Paraphilia, unspecified: Secondary | ICD-10-CM | POA: Diagnosis not present

## 2019-03-10 MED FILL — FLUoxetine HCL 10 MG CAPS: 10 | 90 days supply | Qty: 90 | Fill #0

## 2019-04-05 DIAGNOSIS — F411 Generalized anxiety disorder: Secondary | ICD-10-CM | POA: Diagnosis not present

## 2019-04-21 DIAGNOSIS — F4323 Adjustment disorder with mixed anxiety and depressed mood: Secondary | ICD-10-CM | POA: Diagnosis not present

## 2019-05-17 DIAGNOSIS — F411 Generalized anxiety disorder: Secondary | ICD-10-CM | POA: Diagnosis not present

## 2019-05-20 DIAGNOSIS — Z03818 Encounter for observation for suspected exposure to other biological agents ruled out: Secondary | ICD-10-CM | POA: Diagnosis not present

## 2019-06-02 DIAGNOSIS — F428 Other obsessive-compulsive disorder: Secondary | ICD-10-CM | POA: Diagnosis not present

## 2019-06-02 DIAGNOSIS — F4323 Adjustment disorder with mixed anxiety and depressed mood: Secondary | ICD-10-CM | POA: Diagnosis not present

## 2019-06-28 DIAGNOSIS — H11133 Conjunctival pigmentations, bilateral: Secondary | ICD-10-CM | POA: Diagnosis not present

## 2019-06-28 DIAGNOSIS — H40011 Open angle with borderline findings, low risk, right eye: Secondary | ICD-10-CM | POA: Diagnosis not present

## 2019-06-28 DIAGNOSIS — H401222 Low-tension glaucoma, left eye, moderate stage: Secondary | ICD-10-CM | POA: Diagnosis not present

## 2019-06-28 DIAGNOSIS — D2330 Other benign neoplasm of skin of unspecified part of face: Secondary | ICD-10-CM | POA: Diagnosis not present

## 2019-06-28 MED FILL — LUMIGAN 0.01% EYE DROPS: 0.01 | 18 days supply | Qty: 3 | Fill #0

## 2019-07-11 MED FILL — LUMIGAN 0.01% EYE DROPS: 0.01 | 18 days supply | Qty: 3 | Fill #0

## 2019-08-03 DIAGNOSIS — M65311 Trigger thumb, right thumb: Secondary | ICD-10-CM | POA: Diagnosis not present

## 2019-08-03 DIAGNOSIS — M65312 Trigger thumb, left thumb: Secondary | ICD-10-CM | POA: Diagnosis not present

## 2019-08-03 DIAGNOSIS — M65352 Trigger finger, left little finger: Secondary | ICD-10-CM | POA: Diagnosis not present

## 2019-08-08 DIAGNOSIS — M65312 Trigger thumb, left thumb: Secondary | ICD-10-CM | POA: Diagnosis not present

## 2019-08-08 DIAGNOSIS — M65311 Trigger thumb, right thumb: Secondary | ICD-10-CM | POA: Diagnosis not present

## 2019-08-24 DIAGNOSIS — F4323 Adjustment disorder with mixed anxiety and depressed mood: Secondary | ICD-10-CM | POA: Diagnosis not present

## 2019-08-24 DIAGNOSIS — F428 Other obsessive-compulsive disorder: Secondary | ICD-10-CM | POA: Diagnosis not present

## 2019-08-31 MED FILL — LATANOPROST 0.005% EYE DRP: 0.005 | 18 days supply | Qty: 3 | Fill #0

## 2019-10-26 ENCOUNTER — Other Ambulatory Visit (HOSPITAL_COMMUNITY): Payer: Self-pay | Admitting: Optometry

## 2019-10-26 DIAGNOSIS — H11133 Conjunctival pigmentations, bilateral: Secondary | ICD-10-CM | POA: Diagnosis not present

## 2019-10-26 DIAGNOSIS — H401222 Low-tension glaucoma, left eye, moderate stage: Secondary | ICD-10-CM | POA: Diagnosis not present

## 2019-10-26 DIAGNOSIS — D2330 Other benign neoplasm of skin of unspecified part of face: Secondary | ICD-10-CM | POA: Diagnosis not present

## 2019-10-26 DIAGNOSIS — H40011 Open angle with borderline findings, low risk, right eye: Secondary | ICD-10-CM | POA: Diagnosis not present

## 2019-10-26 MED FILL — LATANOPROST 0.005% EYE DRP: 0.005 | 25 days supply | Qty: 3 | Fill #0

## 2020-04-11 DIAGNOSIS — H11133 Conjunctival pigmentations, bilateral: Secondary | ICD-10-CM | POA: Diagnosis not present

## 2020-04-11 DIAGNOSIS — H401222 Low-tension glaucoma, left eye, moderate stage: Secondary | ICD-10-CM | POA: Diagnosis not present

## 2020-04-11 DIAGNOSIS — H40011 Open angle with borderline findings, low risk, right eye: Secondary | ICD-10-CM | POA: Diagnosis not present

## 2020-05-14 MED FILL — LATANOPROST 0.005% EYE DRP: 0.005 | 25 days supply | Qty: 3 | Fill #1

## 2020-08-08 ENCOUNTER — Other Ambulatory Visit (HOSPITAL_COMMUNITY): Payer: Self-pay | Admitting: Optometry

## 2020-08-08 DIAGNOSIS — D2330 Other benign neoplasm of skin of unspecified part of face: Secondary | ICD-10-CM | POA: Diagnosis not present

## 2020-08-08 DIAGNOSIS — H40011 Open angle with borderline findings, low risk, right eye: Secondary | ICD-10-CM | POA: Diagnosis not present

## 2020-08-08 DIAGNOSIS — H401222 Low-tension glaucoma, left eye, moderate stage: Secondary | ICD-10-CM | POA: Diagnosis not present

## 2020-08-08 DIAGNOSIS — H11133 Conjunctival pigmentations, bilateral: Secondary | ICD-10-CM | POA: Diagnosis not present

## 2020-08-08 MED FILL — LATANOPROST 0.005% EYE DRP: 0.005 | 25 days supply | Qty: 3 | Fill #0

## 2020-10-02 DIAGNOSIS — E669 Obesity, unspecified: Secondary | ICD-10-CM | POA: Diagnosis not present

## 2020-10-02 DIAGNOSIS — E7800 Pure hypercholesterolemia, unspecified: Secondary | ICD-10-CM | POA: Diagnosis not present

## 2020-10-02 DIAGNOSIS — Z125 Encounter for screening for malignant neoplasm of prostate: Secondary | ICD-10-CM | POA: Diagnosis not present

## 2020-10-02 DIAGNOSIS — R030 Elevated blood-pressure reading, without diagnosis of hypertension: Secondary | ICD-10-CM | POA: Diagnosis not present

## 2020-10-02 DIAGNOSIS — Z0000 Encounter for general adult medical examination without abnormal findings: Secondary | ICD-10-CM | POA: Diagnosis not present

## 2020-10-02 DIAGNOSIS — Z23 Encounter for immunization: Secondary | ICD-10-CM | POA: Diagnosis not present

## 2020-10-02 DIAGNOSIS — H40053 Ocular hypertension, bilateral: Secondary | ICD-10-CM | POA: Diagnosis not present

## 2020-11-19 DIAGNOSIS — Z23 Encounter for immunization: Secondary | ICD-10-CM | POA: Diagnosis not present

## 2020-12-05 DIAGNOSIS — Z20828 Contact with and (suspected) exposure to other viral communicable diseases: Secondary | ICD-10-CM | POA: Diagnosis not present

## 2020-12-05 DIAGNOSIS — Z20822 Contact with and (suspected) exposure to covid-19: Secondary | ICD-10-CM | POA: Diagnosis not present

## 2020-12-06 ENCOUNTER — Encounter (INDEPENDENT_AMBULATORY_CARE_PROVIDER_SITE_OTHER): Payer: Self-pay | Admitting: *Deleted

## 2020-12-07 ENCOUNTER — Other Ambulatory Visit (HOSPITAL_COMMUNITY): Payer: Self-pay

## 2020-12-07 DIAGNOSIS — Z20822 Contact with and (suspected) exposure to covid-19: Secondary | ICD-10-CM | POA: Diagnosis not present

## 2020-12-07 MED ORDER — LATANOPROST 0.005 % OP SOLN
1.0000 [drp] | Freq: Every day | OPHTHALMIC | 3 refills | Status: DC
  Filled 2020-12-07: qty 2.5, 25d supply, fill #0

## 2020-12-09 DIAGNOSIS — U071 COVID-19: Secondary | ICD-10-CM | POA: Diagnosis not present

## 2020-12-10 DIAGNOSIS — U071 COVID-19: Secondary | ICD-10-CM | POA: Diagnosis not present

## 2020-12-12 DIAGNOSIS — Z20822 Contact with and (suspected) exposure to covid-19: Secondary | ICD-10-CM | POA: Diagnosis not present

## 2020-12-15 DIAGNOSIS — U071 COVID-19: Secondary | ICD-10-CM | POA: Diagnosis not present

## 2020-12-20 DIAGNOSIS — U071 COVID-19: Secondary | ICD-10-CM | POA: Diagnosis not present

## 2020-12-20 DIAGNOSIS — Z20822 Contact with and (suspected) exposure to covid-19: Secondary | ICD-10-CM | POA: Diagnosis not present

## 2021-05-09 ENCOUNTER — Other Ambulatory Visit (HOSPITAL_COMMUNITY): Payer: Self-pay

## 2021-05-09 MED ORDER — LATANOPROST 0.005 % OP SOLN
1.0000 [drp] | Freq: Every day | OPHTHALMIC | 0 refills | Status: DC
  Filled 2021-05-09: qty 2.5, 30d supply, fill #0

## 2021-05-09 MED ORDER — LATANOPROST 0.005 % OP SOLN
1.0000 [drp] | Freq: Every evening | OPHTHALMIC | 3 refills | Status: DC
  Filled 2021-05-09: qty 2.5, 25d supply, fill #0

## 2021-05-13 DIAGNOSIS — R109 Unspecified abdominal pain: Secondary | ICD-10-CM | POA: Diagnosis not present

## 2021-06-26 ENCOUNTER — Other Ambulatory Visit (HOSPITAL_COMMUNITY): Payer: Self-pay

## 2021-06-26 DIAGNOSIS — H401222 Low-tension glaucoma, left eye, moderate stage: Secondary | ICD-10-CM | POA: Diagnosis not present

## 2021-06-26 DIAGNOSIS — H11133 Conjunctival pigmentations, bilateral: Secondary | ICD-10-CM | POA: Diagnosis not present

## 2021-06-26 DIAGNOSIS — H40011 Open angle with borderline findings, low risk, right eye: Secondary | ICD-10-CM | POA: Diagnosis not present

## 2021-06-26 DIAGNOSIS — D2330 Other benign neoplasm of skin of unspecified part of face: Secondary | ICD-10-CM | POA: Diagnosis not present

## 2021-06-26 MED ORDER — LATANOPROST 0.005 % OP SOLN
1.0000 [drp] | Freq: Every evening | OPHTHALMIC | 3 refills | Status: DC
  Filled 2021-06-26: qty 2.5, 25d supply, fill #0
  Filled 2021-12-26 – 2022-01-09 (×2): qty 2.5, 25d supply, fill #1
  Filled 2022-05-22: qty 2.5, 25d supply, fill #2

## 2021-07-02 DIAGNOSIS — Z01818 Encounter for other preprocedural examination: Secondary | ICD-10-CM | POA: Diagnosis not present

## 2021-10-03 DIAGNOSIS — Z125 Encounter for screening for malignant neoplasm of prostate: Secondary | ICD-10-CM | POA: Diagnosis not present

## 2021-10-03 DIAGNOSIS — E7800 Pure hypercholesterolemia, unspecified: Secondary | ICD-10-CM | POA: Diagnosis not present

## 2021-10-03 DIAGNOSIS — Z0000 Encounter for general adult medical examination without abnormal findings: Secondary | ICD-10-CM | POA: Diagnosis not present

## 2021-10-04 ENCOUNTER — Other Ambulatory Visit: Payer: Self-pay | Admitting: Internal Medicine

## 2021-10-04 DIAGNOSIS — E7800 Pure hypercholesterolemia, unspecified: Secondary | ICD-10-CM

## 2021-10-11 ENCOUNTER — Other Ambulatory Visit (HOSPITAL_COMMUNITY): Payer: Self-pay

## 2021-10-11 MED ORDER — PEG 3350-KCL-NA BICARB-NACL 420 G PO SOLR
ORAL | 0 refills | Status: DC
  Filled 2021-10-11: qty 4000, 1d supply, fill #0

## 2021-10-18 DIAGNOSIS — Z86010 Personal history of colonic polyps: Secondary | ICD-10-CM | POA: Diagnosis not present

## 2021-10-18 DIAGNOSIS — K648 Other hemorrhoids: Secondary | ICD-10-CM | POA: Diagnosis not present

## 2021-11-18 ENCOUNTER — Ambulatory Visit
Admission: RE | Admit: 2021-11-18 | Discharge: 2021-11-18 | Disposition: A | Discharge status: Home / Self Care | Payer: No Typology Code available for payment source | Source: Ambulatory Visit | Attending: Internal Medicine | Admitting: Internal Medicine

## 2021-11-18 DIAGNOSIS — E7800 Pure hypercholesterolemia, unspecified: Secondary | ICD-10-CM

## 2021-12-26 ENCOUNTER — Other Ambulatory Visit (HOSPITAL_COMMUNITY): Payer: Self-pay

## 2022-01-03 ENCOUNTER — Other Ambulatory Visit (HOSPITAL_COMMUNITY): Payer: Self-pay

## 2022-01-06 DIAGNOSIS — H11133 Conjunctival pigmentations, bilateral: Secondary | ICD-10-CM | POA: Diagnosis not present

## 2022-01-06 DIAGNOSIS — D2330 Other benign neoplasm of skin of unspecified part of face: Secondary | ICD-10-CM | POA: Diagnosis not present

## 2022-01-06 DIAGNOSIS — H40011 Open angle with borderline findings, low risk, right eye: Secondary | ICD-10-CM | POA: Diagnosis not present

## 2022-01-06 DIAGNOSIS — H401222 Low-tension glaucoma, left eye, moderate stage: Secondary | ICD-10-CM | POA: Diagnosis not present

## 2022-01-09 ENCOUNTER — Other Ambulatory Visit (HOSPITAL_COMMUNITY): Payer: Self-pay

## 2022-05-19 DIAGNOSIS — H401222 Low-tension glaucoma, left eye, moderate stage: Secondary | ICD-10-CM | POA: Diagnosis not present

## 2022-05-19 DIAGNOSIS — H40011 Open angle with borderline findings, low risk, right eye: Secondary | ICD-10-CM | POA: Diagnosis not present

## 2022-05-19 DIAGNOSIS — H11133 Conjunctival pigmentations, bilateral: Secondary | ICD-10-CM | POA: Diagnosis not present

## 2022-05-19 DIAGNOSIS — D2330 Other benign neoplasm of skin of unspecified part of face: Secondary | ICD-10-CM | POA: Diagnosis not present

## 2022-05-22 ENCOUNTER — Other Ambulatory Visit (HOSPITAL_COMMUNITY): Payer: Self-pay

## 2022-09-10 IMAGING — CT CT CARDIAC CORONARY ARTERY CALCIUM SCORE
3 series · 14 of 20 positions shown, 16 images · non-contrast
Comparison: None.

CLINICAL DATA: Elevated cholesterol. Family history of heart
disease.

EXAM:
CT CARDIAC CORONARY ARTERY CALCIUM SCORE
TECHNIQUE: Non-contrast imaging through the heart was performed using
prospective ECG gating. Image post processing was performed on an
independent workstation, allowing for quantitative analysis of the
heart and coronary arteries. Note that this exam targets the heart
and the chest was not imaged in its entirety.

[Series 2: calcium scoring 2.00 qr36 bestdiast 70% hrt calciu · axial · 0.43mm/px · z∈[+1662,+1734]mm · 4 of 60 slices shown]
[im 12/60  vessel]
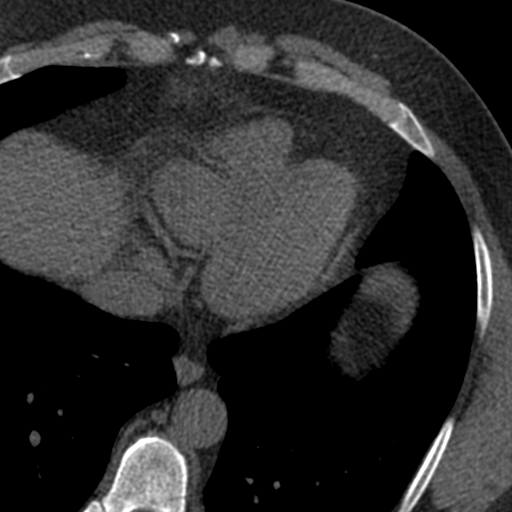
[im 24/60  vessel]
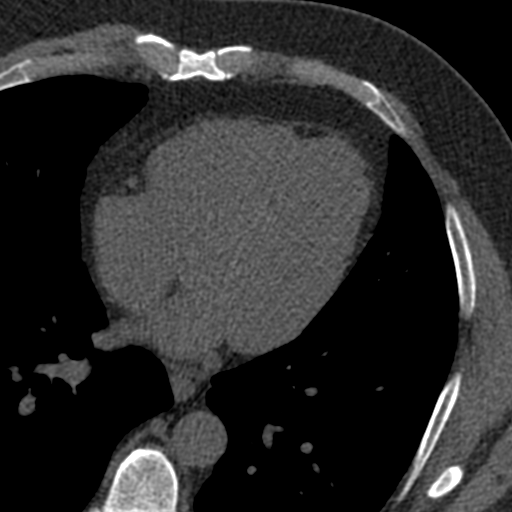
[im 36/60  vessel]
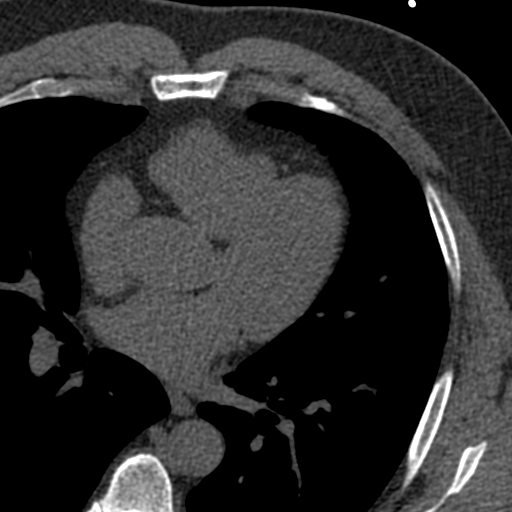
[im 48/60  vessel]
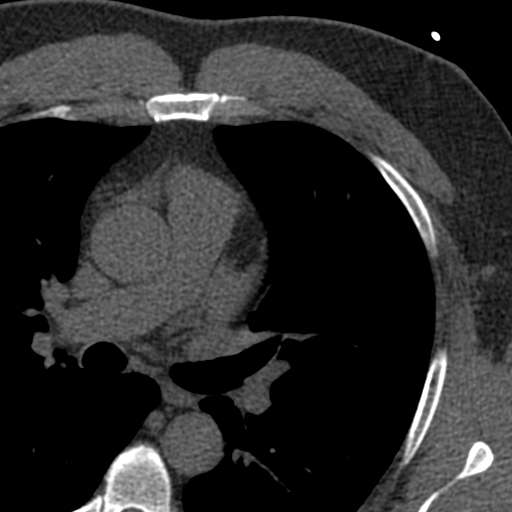

[Series 3: calcium scoring 2.00 br40 bestdiast 70% axial · axial · 0.62mm/px · z∈[+1658,+1738]mm · 5 of 60 slices shown, 7 images]
[im 10/60  vessel]
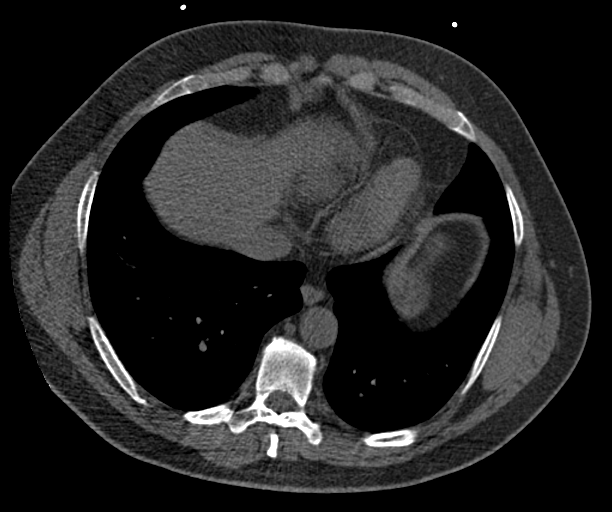
[im 10/60  lung]
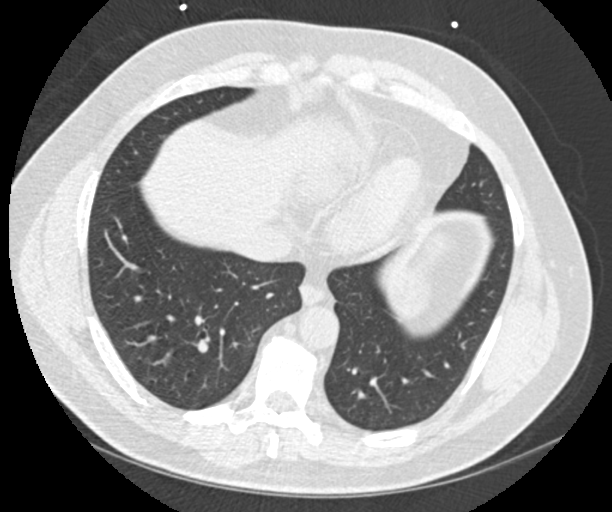
[im 20/60  vessel]
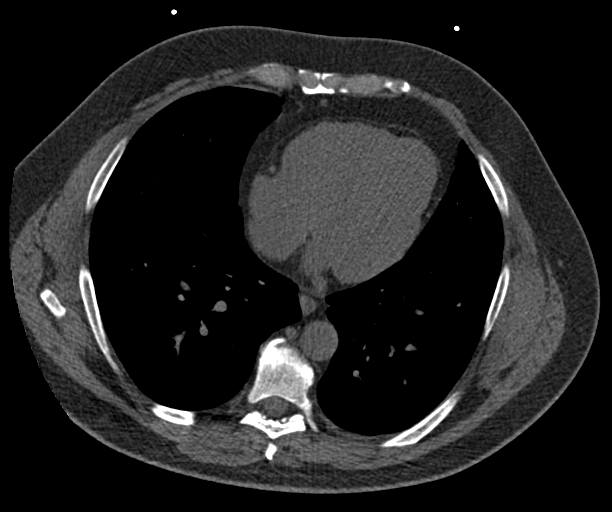
[im 30/60  vessel]
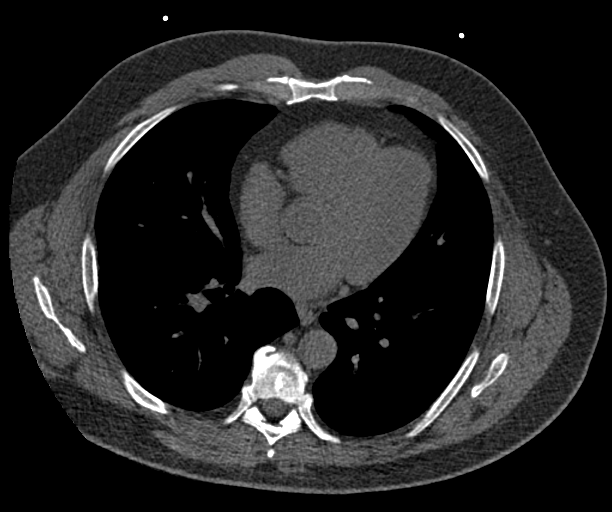
[im 40/60  vessel]
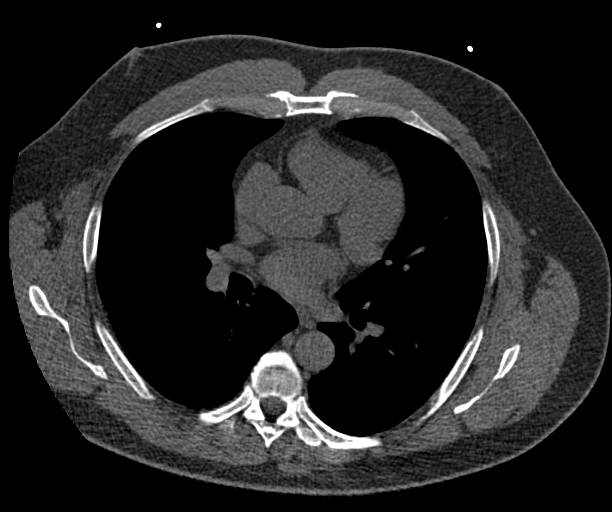
[im 50/60  vessel]
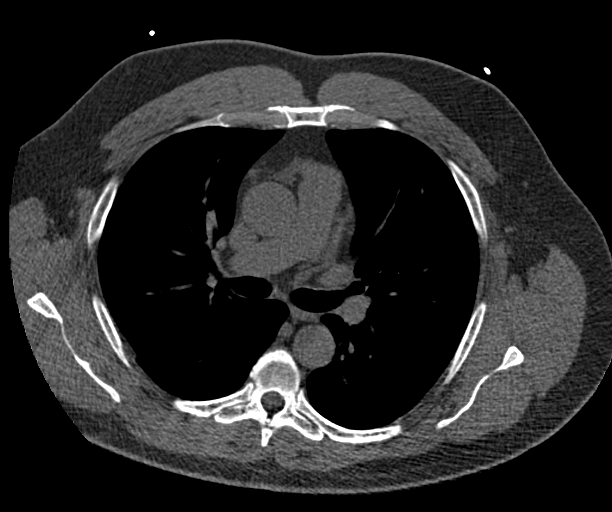
[im 50/60  lung]
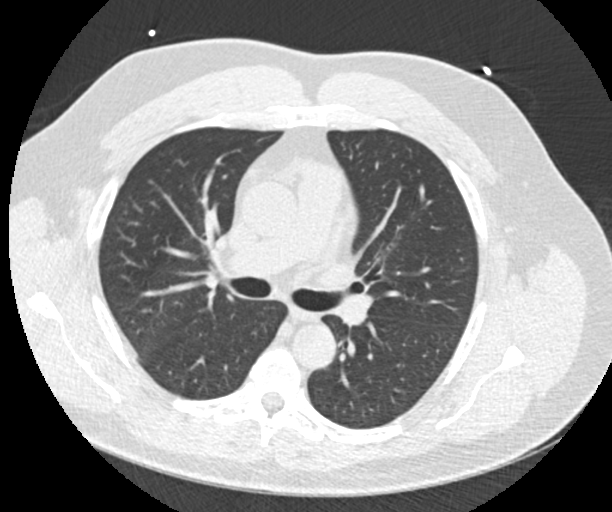

[Series 9: calcium scoring 2.00 br60 bestdiast 70% lungs · axial · 0.62mm/px · z∈[+1658,+1738]mm · 5 of 60 slices shown]
[im 10/60  vessel]
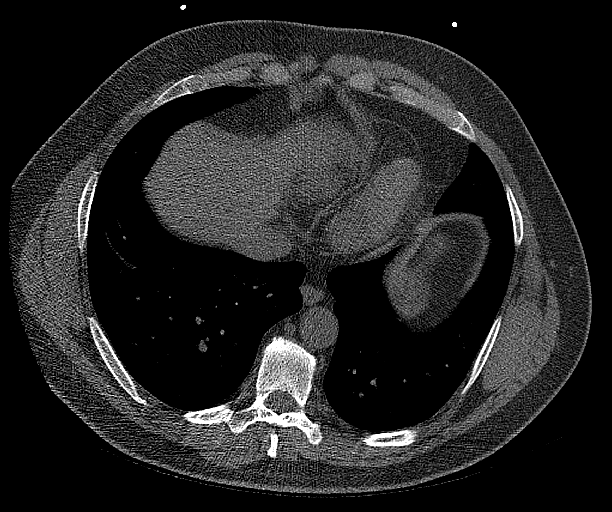
[im 20/60  vessel]
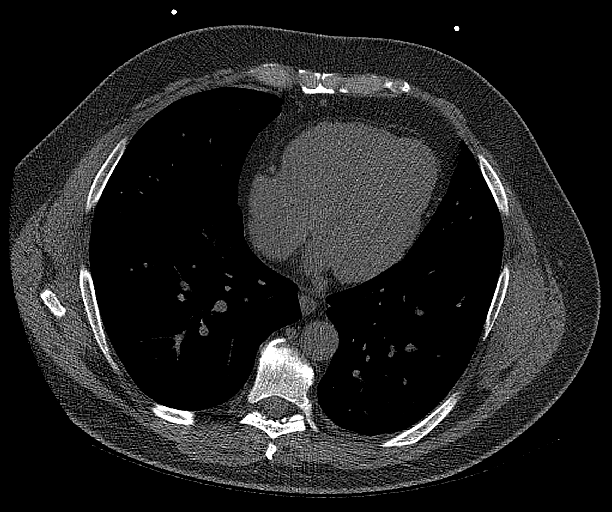
[im 30/60  vessel]
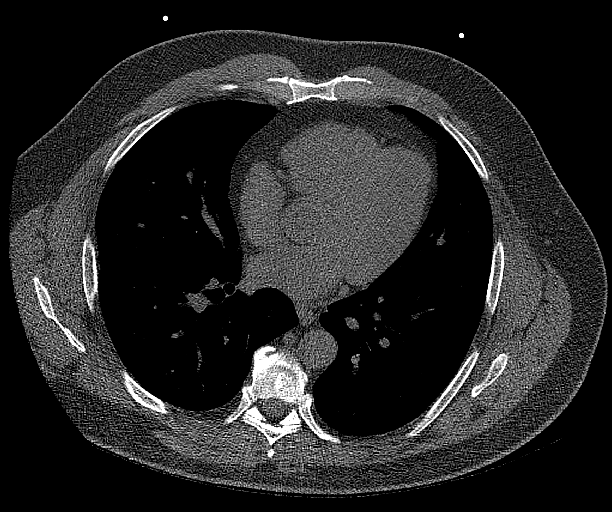
[im 40/60  vessel]
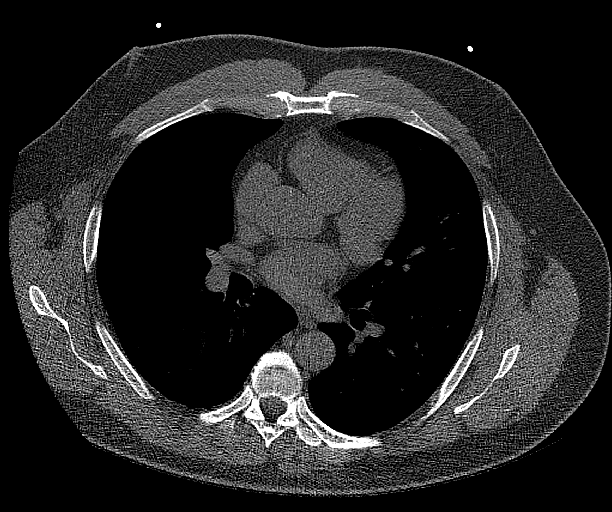
[im 50/60  vessel]
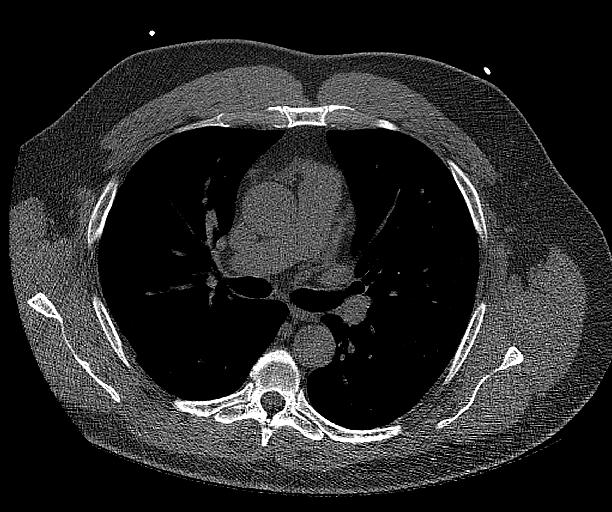

[14 of 20 positions shown; findings below may reference images not displayed]

FINDINGS: CORONARY CALCIUM SCORES:

Total Agatston Score: 0- No coronary calcium identified.

AORTA MEASUREMENTS:

Ascending Aorta: 31 mm

Descending Aorta: 29 mm

OTHER FINDINGS:

Cardiovascular: Normal aortic caliber. Normal heart size, without
pericardial effusion.

Mediastinum/Nodes: No imaged thoracic adenopathy.

Lungs/Pleura: No pleural fluid.  Clear imaged lungs.

Upper Abdomen: Mild hepatic steatosis. Normal imaged portions of the
spleen, stomach.

Musculoskeletal: No acute osseous abnormality.
IMPRESSION: 1. No coronary calcium identified.
2. No acute process in the imaged chest.
3. Mild hepatic steatosis.

## 2022-09-16 ENCOUNTER — Other Ambulatory Visit (HOSPITAL_COMMUNITY): Payer: Self-pay

## 2022-09-16 MED ORDER — LATANOPROST 0.005 % OP SOLN
1.0000 [drp] | Freq: Every evening | OPHTHALMIC | 0 refills | Status: DC
  Filled 2022-09-16: qty 7.5, 75d supply, fill #0

## 2022-10-06 DIAGNOSIS — E7800 Pure hypercholesterolemia, unspecified: Secondary | ICD-10-CM | POA: Diagnosis not present

## 2022-10-06 DIAGNOSIS — R7989 Other specified abnormal findings of blood chemistry: Secondary | ICD-10-CM | POA: Diagnosis not present

## 2022-10-06 DIAGNOSIS — Z0000 Encounter for general adult medical examination without abnormal findings: Secondary | ICD-10-CM | POA: Diagnosis not present

## 2022-10-06 DIAGNOSIS — Z125 Encounter for screening for malignant neoplasm of prostate: Secondary | ICD-10-CM | POA: Diagnosis not present

## 2022-10-06 DIAGNOSIS — R945 Abnormal results of liver function studies: Secondary | ICD-10-CM | POA: Diagnosis not present

## 2022-11-10 DIAGNOSIS — H40011 Open angle with borderline findings, low risk, right eye: Secondary | ICD-10-CM | POA: Diagnosis not present

## 2022-11-10 DIAGNOSIS — D2330 Other benign neoplasm of skin of unspecified part of face: Secondary | ICD-10-CM | POA: Diagnosis not present

## 2022-11-10 DIAGNOSIS — H11133 Conjunctival pigmentations, bilateral: Secondary | ICD-10-CM | POA: Diagnosis not present

## 2022-11-10 DIAGNOSIS — H401222 Low-tension glaucoma, left eye, moderate stage: Secondary | ICD-10-CM | POA: Diagnosis not present

## 2023-01-14 ENCOUNTER — Other Ambulatory Visit (HOSPITAL_COMMUNITY): Payer: Self-pay

## 2023-01-14 DIAGNOSIS — N5201 Erectile dysfunction due to arterial insufficiency: Secondary | ICD-10-CM | POA: Diagnosis not present

## 2023-01-14 MED ORDER — TADALAFIL 20 MG PO TABS
10.0000 mg | ORAL_TABLET | ORAL | 11 refills | Status: DC | PRN
  Filled 2023-01-14: qty 30, 90d supply, fill #0
  Filled 2023-05-15: qty 30, 90d supply, fill #1
  Filled 2023-08-27: qty 30, 90d supply, fill #2
  Filled 2023-11-26: qty 30, 90d supply, fill #3

## 2023-03-18 DIAGNOSIS — D2330 Other benign neoplasm of skin of unspecified part of face: Secondary | ICD-10-CM | POA: Diagnosis not present

## 2023-03-18 DIAGNOSIS — H11133 Conjunctival pigmentations, bilateral: Secondary | ICD-10-CM | POA: Diagnosis not present

## 2023-03-18 DIAGNOSIS — H40011 Open angle with borderline findings, low risk, right eye: Secondary | ICD-10-CM | POA: Diagnosis not present

## 2023-03-18 DIAGNOSIS — H401222 Low-tension glaucoma, left eye, moderate stage: Secondary | ICD-10-CM | POA: Diagnosis not present

## 2023-04-29 DIAGNOSIS — Z23 Encounter for immunization: Secondary | ICD-10-CM | POA: Diagnosis not present

## 2023-05-13 DIAGNOSIS — Z23 Encounter for immunization: Secondary | ICD-10-CM | POA: Diagnosis not present

## 2023-05-15 ENCOUNTER — Other Ambulatory Visit: Payer: Self-pay

## 2023-05-18 ENCOUNTER — Other Ambulatory Visit (HOSPITAL_COMMUNITY): Payer: Self-pay

## 2023-06-02 ENCOUNTER — Other Ambulatory Visit (INDEPENDENT_AMBULATORY_CARE_PROVIDER_SITE_OTHER): Payer: Self-pay

## 2023-06-02 ENCOUNTER — Ambulatory Visit (INDEPENDENT_AMBULATORY_CARE_PROVIDER_SITE_OTHER): Payer: BC Managed Care – PPO | Admitting: Orthopaedic Surgery

## 2023-06-02 ENCOUNTER — Encounter: Payer: Self-pay | Admitting: Orthopaedic Surgery

## 2023-06-02 DIAGNOSIS — M25562 Pain in left knee: Secondary | ICD-10-CM | POA: Diagnosis not present

## 2023-06-02 NOTE — Progress Notes (Signed)
Office Visit Note   Patient: William Haley           Date of Birth: 1970/09/10           MRN: [id number] Visit Date: 06/02/2023              Requested by: No referring provider defined for this encounter. PCP: William Funk, MD (Inactive)   Assessment & Plan: Visit Diagnoses:  1. 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  Not reporting any mechanical symptoms at this time.  Has gotten much better in the last week with RICE methods.  Treatment options were given to continue RICE versus cortisone injection.  For now he is going to hold off on the injection.  Follow-Up Instructions: No follow-ups on file.   Orders:  Orders Placed This Encounter  Procedures   XR KNEE 3 VIEW LEFT   No orders of the defined types were placed in this encounter.     Procedures: No procedures performed   Clinical Data: No additional findings.   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  He has had 3 prior surgeries to the knee.  He has had ACL and meniscal debridement.  He has also had a debridement also.  Feels like he has a knot posteriorly.  He injured it when he stepped off the sidewalk and felt something that did not feel right.  Is currently not reporting any mechanical symptoms.  Review of Systems  Constitutional: Negative.   HENT: Negative.    Eyes: Negative.   Respiratory: Negative.    Cardiovascular: Negative.   Gastrointestinal: Negative.   Endocrine: Negative.   Genitourinary: Negative.   Skin: Negative.   Allergic/Immunologic: Negative.   Neurological: Negative.   Hematological: Negative.    Psychiatric/Behavioral: Negative.    All other systems reviewed and are negative.    Objective: Vital Signs: There were no vitals taken for this visit.  Physical Exam Vitals and nursing note reviewed.  Constitutional:      Appearance: He is well-developed.  Pulmonary:     Effort: Pulmonary effort is normal.  Abdominal:     Palpations: Abdomen is soft.  Skin:    General: Skin is warm.  Neurological:     Mental Status: He is alert and oriented to person, place, and time.  Psychiatric:        Behavior: Behavior normal.        Thought Content: Thought content normal.        Judgment: Judgment normal.     Ortho Exam Exam of the left knee shows fully healed postsurgical scars.  Range of motion is normal.  Small joint effusion.  No significant joint line tenderness.  Collaterals and cruciates are stable. Specialty Comments:  No specialty comments available.  Imaging: XR KNEE 3 VIEW LEFT  Result Date: 06/02/2023 X-rays of the knee show status post ACL reconstruction with a metallic staple in the proximal tibia.  There is widespread degenerative changes.    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 No pertinent past medical history.  Family History  Problem Relation Age of Onset   Cancer Other    Anesthesia problems Neg Hx    Hypotension Neg Hx    Malignant hyperthermia Neg Hx    Pseudochol deficiency Neg Hx     Past Surgical History:  Procedure Laterality Date   COLONOSCOPY N/A 12/13/2015   Procedure: COLONOSCOPY;  Surgeon: Malissa Hippo, MD;   [facility];  Service: Endoscopy;  Laterality: N/A;  1200   KNEE ARTHROSCOPY  1989   left    KNEE SURGERY  left knee 1989   left knee 1989     POLYPECTOMY  12/13/2015   Procedure: POLYPECTOMY;  Surgeon: Malissa Hippo, MD;  [facility];  Service: Endoscopy;;  Polyp at cecum   Social History   Occupational History   Occupation: healthcare    Employer: [employer]  Tobacco Use   Smoking status: Never   Smokeless tobacco: Not on file  Substance and Sexual Activity   Alcohol use: No   Drug use: No   Sexual activity: Yes    Birth control/protection: None

## 2023-06-04 ENCOUNTER — Encounter: Payer: Self-pay | Admitting: Orthopaedic Surgery

## 2023-06-16 ENCOUNTER — Ambulatory Visit: Payer: BC Managed Care – PPO | Admitting: Orthopaedic Surgery

## 2023-06-16 ENCOUNTER — Encounter: Payer: Self-pay | Admitting: Orthopaedic Surgery

## 2023-06-16 DIAGNOSIS — M25562 Pain in left knee: Secondary | ICD-10-CM

## 2023-06-16 MED ORDER — BUPIVACAINE HCL 0.5 % IJ SOLN
2.0000 mL | INTRAMUSCULAR | Status: AC | PRN
  Administered 2023-06-16: 2 mL via INTRA_ARTICULAR

## 2023-06-16 MED ORDER — METHYLPREDNISOLONE ACETATE 40 MG/ML IJ SUSP
40.0000 mg | INTRAMUSCULAR | Status: AC | PRN
  Administered 2023-06-16: 40 mg via INTRA_ARTICULAR

## 2023-06-16 MED ORDER — LIDOCAINE HCL 1 % IJ SOLN
2.0000 mL | INTRAMUSCULAR | Status: AC | PRN
  Administered 2023-06-16: 2 mL

## 2023-06-16 NOTE — Progress Notes (Signed)
Office Visit Note   Patient: William Haley           Date of Birth: 1970/11/25           MRN: [id number] Visit Date: 06/16/2023              Requested by: No referring provider defined for this encounter. PCP: Kirby Funk, MD (Inactive)   Assessment & Plan: Visit Diagnoses:  1. Acute pain of left knee     Plan: [name] is a 52 year old gentleman with ongoing left knee pain.  He would like to try cortisone injection today.  He tolerated this well.  Will see him back if he does not feel any improvement in about 6 weeks.  Follow-Up Instructions: No follow-ups on file.   Orders:  No orders of the defined types were placed in this encounter.  No orders of the defined types were placed in this encounter.     Procedures: Large Joint Inj: L knee on 06/16/2023 8:37 AM Details: 22 G needle Medications: 2 mL bupivacaine 0.5 %; 2 mL lidocaine 1 %; 40 mg methylPREDNISolone acetate 40 MG/ML Outcome: tolerated well, no immediate complications Patient was prepped and draped in the usual sterile fashion.       Clinical Data: No additional findings.   Subjective: Chief Complaint  Patient presents with   Left Knee - Pain    HPI [name] returns today for follow-up evaluation of left knee pain.  He states that the swelling has improved but he continues to have symptoms that have not completely resolved.  He would like to try cortisone injection today.  Review of Systems   Objective: Vital Signs: There were no vitals taken for this visit.  Physical Exam  Ortho Exam Exam of the left knee shows a trace joint effusion.  Otherwise exam is unchanged from prior visit. Specialty Comments:  No specialty comments available.  Imaging: No results found.   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 No pertinent past medical history.  Family History  Problem Relation Age of Onset   Cancer Other    Anesthesia problems Neg Hx    Hypotension Neg Hx    Malignant hyperthermia Neg Hx    Pseudochol deficiency Neg Hx     Past Surgical History:  Procedure Laterality Date   COLONOSCOPY N/A 12/13/2015   Procedure: COLONOSCOPY;  Surgeon: Malissa Hippo, MD;  [facility];  Service: Endoscopy;  Laterality: N/A;  1200   KNEE ARTHROSCOPY  1989   left    KNEE SURGERY  left knee 1989   left knee 1989     POLYPECTOMY  12/13/2015   Procedure: POLYPECTOMY;  Surgeon: Malissa Hippo, MD;  [facility];  Service: Endoscopy;;  Polyp at cecum   Social History   Occupational History   Occupation: healthcare    Employer: [employer]  Tobacco Use   Smoking status: Never   Smokeless tobacco: Not on file  Substance and Sexual Activity   Alcohol use: No   Drug use: No  Sexual activity: Yes    Birth control/protection: None

## 2023-07-29 DIAGNOSIS — H401222 Low-tension glaucoma, left eye, moderate stage: Secondary | ICD-10-CM | POA: Diagnosis not present

## 2023-07-29 DIAGNOSIS — H11133 Conjunctival pigmentations, bilateral: Secondary | ICD-10-CM | POA: Diagnosis not present

## 2023-07-29 DIAGNOSIS — H40011 Open angle with borderline findings, low risk, right eye: Secondary | ICD-10-CM | POA: Diagnosis not present

## 2023-07-29 DIAGNOSIS — D2330 Other benign neoplasm of skin of unspecified part of face: Secondary | ICD-10-CM | POA: Diagnosis not present

## 2023-08-27 ENCOUNTER — Other Ambulatory Visit (HOSPITAL_COMMUNITY): Payer: Self-pay

## 2023-10-07 ENCOUNTER — Other Ambulatory Visit (HOSPITAL_COMMUNITY): Payer: Self-pay

## 2023-10-07 DIAGNOSIS — N529 Male erectile dysfunction, unspecified: Secondary | ICD-10-CM | POA: Diagnosis not present

## 2023-10-07 DIAGNOSIS — Z125 Encounter for screening for malignant neoplasm of prostate: Secondary | ICD-10-CM | POA: Diagnosis not present

## 2023-10-07 DIAGNOSIS — Z0000 Encounter for general adult medical examination without abnormal findings: Secondary | ICD-10-CM | POA: Diagnosis not present

## 2023-10-07 DIAGNOSIS — E7800 Pure hypercholesterolemia, unspecified: Secondary | ICD-10-CM | POA: Diagnosis not present

## 2023-10-07 MED ORDER — ROSUVASTATIN CALCIUM 10 MG PO TABS
10.0000 mg | ORAL_TABLET | Freq: Every day | ORAL | 3 refills | Status: AC
  Filled 2023-10-07: qty 90, 90d supply, fill #0
  Filled 2023-12-21: qty 90, 90d supply, fill #1
  Filled 2024-03-30: qty 90, 90d supply, fill #2
  Filled 2024-06-28: qty 90, 90d supply, fill #3

## 2023-10-08 ENCOUNTER — Other Ambulatory Visit (HOSPITAL_COMMUNITY): Payer: Self-pay

## 2023-10-08 MED ORDER — LATANOPROST 0.005 % OP SOLN
1.0000 [drp] | Freq: Every evening | OPHTHALMIC | 10 refills | Status: AC
  Filled 2023-10-08: qty 7.5, 75d supply, fill #0
  Filled 2024-03-30: qty 7.5, 75d supply, fill #1

## 2023-11-26 ENCOUNTER — Other Ambulatory Visit (HOSPITAL_COMMUNITY): Payer: Self-pay

## 2023-11-30 DIAGNOSIS — H11133 Conjunctival pigmentations, bilateral: Secondary | ICD-10-CM | POA: Diagnosis not present

## 2023-11-30 DIAGNOSIS — D2330 Other benign neoplasm of skin of unspecified part of face: Secondary | ICD-10-CM | POA: Diagnosis not present

## 2023-11-30 DIAGNOSIS — H401222 Low-tension glaucoma, left eye, moderate stage: Secondary | ICD-10-CM | POA: Diagnosis not present

## 2023-11-30 DIAGNOSIS — H40011 Open angle with borderline findings, low risk, right eye: Secondary | ICD-10-CM | POA: Diagnosis not present

## 2023-12-07 ENCOUNTER — Other Ambulatory Visit (HOSPITAL_COMMUNITY): Payer: Self-pay

## 2023-12-07 DIAGNOSIS — E7800 Pure hypercholesterolemia, unspecified: Secondary | ICD-10-CM | POA: Diagnosis not present

## 2023-12-07 DIAGNOSIS — I10 Essential (primary) hypertension: Secondary | ICD-10-CM | POA: Diagnosis not present

## 2023-12-07 MED ORDER — AMLODIPINE BESYLATE 2.5 MG PO TABS
2.5000 mg | ORAL_TABLET | Freq: Every day | ORAL | 3 refills | Status: AC
  Filled 2023-12-07 (×2): qty 90, 90d supply, fill #0

## 2023-12-22 ENCOUNTER — Other Ambulatory Visit (HOSPITAL_COMMUNITY): Payer: Self-pay

## 2024-01-05 DIAGNOSIS — I10 Essential (primary) hypertension: Secondary | ICD-10-CM | POA: Diagnosis not present

## 2024-02-03 ENCOUNTER — Other Ambulatory Visit (HOSPITAL_COMMUNITY): Payer: Self-pay

## 2024-02-03 MED ORDER — TADALAFIL 20 MG PO TABS
10.0000 mg | ORAL_TABLET | ORAL | 11 refills | Status: AC | PRN
  Filled 2024-02-03: qty 30, 90d supply, fill #0

## 2024-02-05 ENCOUNTER — Other Ambulatory Visit (HOSPITAL_COMMUNITY): Payer: Self-pay

## 2024-02-05 MED ORDER — TADALAFIL 20 MG PO TABS
10.0000 mg | ORAL_TABLET | Freq: Every day | ORAL | 11 refills | Status: AC | PRN
  Filled 2024-02-05: qty 30, 30d supply, fill #0
  Filled 2024-04-29: qty 30, 30d supply, fill #1
  Filled 2024-06-28: qty 30, 30d supply, fill #2

## 2024-03-01 ENCOUNTER — Other Ambulatory Visit (HOSPITAL_COMMUNITY): Payer: Self-pay

## 2024-03-01 MED ORDER — AMLODIPINE BESYLATE 2.5 MG PO TABS
2.5000 mg | ORAL_TABLET | Freq: Every day | ORAL | 3 refills | Status: AC
  Filled 2024-03-01: qty 90, 90d supply, fill #0
  Filled 2024-06-04: qty 90, 90d supply, fill #1

## 2024-03-30 ENCOUNTER — Other Ambulatory Visit (HOSPITAL_COMMUNITY): Payer: Self-pay

## 2024-04-13 DIAGNOSIS — H401222 Low-tension glaucoma, left eye, moderate stage: Secondary | ICD-10-CM | POA: Diagnosis not present

## 2024-04-13 DIAGNOSIS — H11133 Conjunctival pigmentations, bilateral: Secondary | ICD-10-CM | POA: Diagnosis not present

## 2024-04-13 DIAGNOSIS — H40011 Open angle with borderline findings, low risk, right eye: Secondary | ICD-10-CM | POA: Diagnosis not present

## 2024-04-29 ENCOUNTER — Other Ambulatory Visit (HOSPITAL_COMMUNITY): Payer: Self-pay

## 2024-05-06 ENCOUNTER — Other Ambulatory Visit (HOSPITAL_COMMUNITY): Payer: Self-pay

## 2024-05-06 DIAGNOSIS — E7800 Pure hypercholesterolemia, unspecified: Secondary | ICD-10-CM | POA: Diagnosis not present

## 2024-05-06 DIAGNOSIS — I10 Essential (primary) hypertension: Secondary | ICD-10-CM | POA: Diagnosis not present

## 2024-05-06 DIAGNOSIS — Z23 Encounter for immunization: Secondary | ICD-10-CM | POA: Diagnosis not present

## 2024-05-23 ENCOUNTER — Encounter: Payer: Self-pay | Admitting: Radiology

## 2024-06-28 ENCOUNTER — Other Ambulatory Visit (HOSPITAL_COMMUNITY): Payer: Self-pay
